# Patient Record
Sex: Male | Born: 1990 | Race: White | Hispanic: No | Marital: Single | Smoking: Current some day smoker
Health system: Southern US, Community
[De-identification: ages and names within clinical notes are randomized; demographics above are authoritative.]

## PROBLEM LIST (undated history)

## (undated) DIAGNOSIS — F32A Depression, unspecified: Secondary | ICD-10-CM

## (undated) DIAGNOSIS — F329 Major depressive disorder, single episode, unspecified: Secondary | ICD-10-CM

## (undated) DIAGNOSIS — B019 Varicella without complication: Secondary | ICD-10-CM

## (undated) HISTORY — DX: Depression, unspecified: F32.A

## (undated) HISTORY — PX: CLAVICLE SURGERY: SHX598

## (undated) HISTORY — DX: Major depressive disorder, single episode, unspecified: F32.9

## (undated) HISTORY — DX: Varicella without complication: B01.9

---
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
## 2012-09-07 ENCOUNTER — Ambulatory Visit: Payer: Self-pay | Admitting: Internal Medicine

## 2012-09-12 ENCOUNTER — Encounter: Payer: Self-pay | Admitting: Internal Medicine

## 2012-09-12 ENCOUNTER — Ambulatory Visit (INDEPENDENT_AMBULATORY_CARE_PROVIDER_SITE_OTHER): Payer: BC Managed Care – PPO | Admitting: Internal Medicine

## 2012-09-12 VITALS — BP 124/76 | HR 84 | Resp 16 | Ht 70.25 in | Wt 141.0 lb

## 2012-09-12 DIAGNOSIS — F988 Other specified behavioral and emotional disorders with onset usually occurring in childhood and adolescence: Secondary | ICD-10-CM

## 2012-09-12 DIAGNOSIS — R110 Nausea: Secondary | ICD-10-CM

## 2012-09-12 DIAGNOSIS — F411 Generalized anxiety disorder: Secondary | ICD-10-CM

## 2012-09-12 DIAGNOSIS — Z7189 Other specified counseling: Secondary | ICD-10-CM

## 2012-09-12 NOTE — Patient Instructions (Signed)
Injury prevention measures . Consider  Taking.  Med on weekend days . Something small for breakfast   And small snacks meals frequently . If losing weight or   persistent or progressive stomach sx then .  Contact us for follow up. Get appt for fasting labs  After you talk with your family.

## 2012-09-12 NOTE — Progress Notes (Signed)
Chief Complaint  Patient presents with  . Establish Care    HPI: Patient comes in as new patient visit . Previous care was  Elsewhere . Parent patients in our practice . Generally heathy but has add and anxiety.  Some gi sx    On add med  For  Years and now changing to other meds . Had hair pulling habit with other meds and recently changed to amphetamine ER 10 mg doing better after a few months  AmySstevenson.   About 2 x per year.      Anxiety : onFluoxetine for  For anxiety  Since  Middle school.  Helps focus in.   No se   Has had some recenet  Nausea feeling without vomiting  change in bowel habits blood in stool  Weight loss. No reg nsaid etoh .some caffeine.   PMH hx  LONG BOARDING INJURY  SURGERY Clavicle. Left still gets  Neuralgia sx at times  ROS: See pertinent positives and negatives per HPI. Attending [employer] interest full time Still active in longboarding ROS:  GEN/ HEENT: No fever, significant weight changes sweats headaches vision problems hearing changes, CV/ PULM; No chest pain shortness of breath cough, syncope,edema  change in exercise tolerance. GI /GU: No  change in bowel habits. No blood in the stool. No significant GU symptoms. SKIN/HEME: ,no acute skin rashes suspicious lesions or bleeding. No lymphadenopathy, nodules, masses.  NEURO/ PSYCH:  No neurologic signs such as weakness numbness. No depression  MM/ Allergy: No unusual infections.  Allergy .   REST of 12 system review negative except as per HPI   Past Medical History  Diagnosis Date  . Chicken pox   . Depression     Family History  Problem Relation Age of Onset  . Miscarriages / [place] Mother     3 miscarriages  . Arthritis Father   . Arthritis Maternal Grandmother   . Arthritis Maternal Grandfather   . Hearing loss Maternal Grandfather     History   Social History  . Marital Status: Single    Spouse Name: N/A    Number of Children: N/A  . Years of Education: N/A    Social History Main Topics  . Smoking status: Current Every Day Smoker  . Smokeless tobacco: Current User  . Alcohol Use: No  . Drug Use: None  . Sexually Active: None   Other Topics Concern  . None   Social History Narrative   HH  Of 3  Parents    Pets dog and cat.    Shifting  From tobacco  2 per day and now   Electronic  .    [address]    caffiene once a week.    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    Outpatient Encounter Prescriptions as of 09/12/2012  Medication Sig Dispense Refill  . amphetamine-dextroamphetamine (ADDERALL XR) 10 MG 24 hr capsule Take 10 mg by mouth every morning.      Marland Kitchen FLUoxetine (PROZAC) 20 MG tablet Take 20 mg by mouth daily.       No facility-administered encounter medications on file as of 09/12/2012.    EXAM:  BP 124/76  Pulse 84  Resp 16  Ht 5' 10.25" (1.784 m)  Wt 141 lb (63.957 kg)  BMI 20.1 kg/m2  SpO2 98%  Body mass index is 20.1 kg/(m^2). Physical Exam: Vital signs reviewed FAO:ZHYQ is a well-developed well-nourished  But  Slender alert  cooperative  White male  who appears  stated age in no acute distress.  HEENT: normocephalic  traumatic , green colored hair Eyes: PERRL EOM's full, conjunctiva clear, Nares: patent no deformity discharge or tenderness., Ears: no deformity EAC's clear TMs with normal landmarks. Pierced ears  Mouth: clear OP, no lesions, edema.  Moist mucous membranes. Dentition in adequate repair. NECK: supple without masses, thyromegaly or bruits. CHEST/PULM:  Clear to auscultation and percussion breath sounds equal no wheeze , rales or rhonchi. No chest wall deformities or tenderness. CV: PMI is nondisplaced, S1 S2 no gallops, murmurs, rubs. Peripheral pulses are full without delay.No JVD .  ABDOMEN: Bowel sounds normal nontender  No guard or rebound, no hepato splenomegal no CVA tenderness.   Extremtities:  No clubbing cyanosis or edema, no acute joint swelling or redness no focal atrophy scar  left upper clavicle area  NEURO:  Oriented x3, cranial nerves 3-12 appear to be intact, no obvious focal weakness,gait within normal limits no abnormal reflexes or asymmetrical SKIN: No acute rashes normal turgor, color, no bruising or petechiae. PSYCH: Oriented, good eye contact, no obvious depression anxiety, cognition and judgment appear normal. LN:  No cervical  adenopathy  ASSESSMENT AND PLAN:  Discussed the following assessment and plan:  Nausea alone - poss dietary and or med related  reviewed dietary   Counseling on health promotion and disease prevention  ADD (attention deficit disorder)  Anxiety state, unspecified Labs when convenient   Fu wellness visit in a year  cpx labs when convenient  -Patient advised to return or notify health care team  if symptoms worsen or persist or new concerns arise.  Disc complete tobacco cesssation Disc  hiv testing no exposure risk etc, declined    Patient Instructions  Injury prevention measures . Consider  Taking.  Med on weekend days . Something small for breakfast   And small snacks meals frequently . If losing weight or   persistent or progressive stomach sx then .  Contact us for follow up. Get appt for fasting labs  After you talk with your family.     Neta Mends. Cornelius Schuitema M.D.

## 2012-09-14 DIAGNOSIS — R110 Nausea: Secondary | ICD-10-CM | POA: Insufficient documentation

## 2012-10-10 ENCOUNTER — Encounter: Payer: Self-pay | Admitting: Internal Medicine

## 2012-10-19 ENCOUNTER — Other Ambulatory Visit (INDEPENDENT_AMBULATORY_CARE_PROVIDER_SITE_OTHER): Payer: BC Managed Care – PPO

## 2012-10-19 DIAGNOSIS — Z0000 Encounter for general adult medical examination without abnormal findings: Secondary | ICD-10-CM

## 2012-10-19 LAB — CBC WITH DIFFERENTIAL/PLATELET
Basophils Absolute: 0 10*3/uL (ref 0.0–0.1)
Basophils Relative: 0.5 % (ref 0.0–3.0)
Eosinophils Absolute: 0.1 10*3/uL (ref 0.0–0.7)
Eosinophils Relative: 1.4 % (ref 0.0–5.0)
HCT: 48.8 % (ref 39.0–52.0)
Hemoglobin: 16.6 g/dL (ref 13.0–17.0)
Lymphocytes Relative: 35 % (ref 12.0–46.0)
Lymphs Abs: 2.3 10*3/uL (ref 0.7–4.0)
MCHC: 34 g/dL (ref 30.0–36.0)
MCV: 89.2 fl (ref 78.0–100.0)
Monocytes Absolute: 0.7 10*3/uL (ref 0.1–1.0)
Monocytes Relative: 10.6 % (ref 3.0–12.0)
Neutro Abs: 3.4 10*3/uL (ref 1.4–7.7)
Neutrophils Relative %: 52.5 % (ref 43.0–77.0)
Platelets: 221 10*3/uL (ref 150.0–400.0)
RBC: 5.48 Mil/uL (ref 4.22–5.81)
RDW: 12.7 % (ref 11.5–14.6)
WBC: 6.5 10*3/uL (ref 4.5–10.5)

## 2012-10-19 LAB — HEPATIC FUNCTION PANEL
ALT: 21 U/L (ref 0–53)
AST: 18 U/L (ref 0–37)
Albumin: 4.8 g/dL (ref 3.5–5.2)
Alkaline Phosphatase: 83 U/L (ref 39–117)
Bilirubin, Direct: 0.1 mg/dL (ref 0.0–0.3)
Total Bilirubin: 1 mg/dL (ref 0.3–1.2)
Total Protein: 7.8 g/dL (ref 6.0–8.3)

## 2012-10-19 LAB — LIPID PANEL
Cholesterol: 134 mg/dL (ref 0–200)
HDL: 32.4 mg/dL — ABNORMAL LOW (ref >39.00)
LDL Cholesterol: 87 mg/dL (ref 0–99)
Total CHOL/HDL Ratio: 4
Triglycerides: 75 mg/dL (ref 0.0–149.0)
VLDL: 15 mg/dL (ref 0.0–40.0)

## 2012-10-19 LAB — BASIC METABOLIC PANEL
BUN: 19 mg/dL (ref 6–23)
CO2: 28 mEq/L (ref 19–32)
Calcium: 9.4 mg/dL (ref 8.4–10.5)
Chloride: 102 mEq/L (ref 96–112)
Creatinine, Ser: 1 mg/dL (ref 0.4–1.5)
GFR: 104.66 mL/min (ref >60.00)
Glucose, Bld: 86 mg/dL (ref 70–99)
Potassium: 3.9 mEq/L (ref 3.5–5.1)
Sodium: 138 mEq/L (ref 135–145)

## 2012-10-19 LAB — TSH: TSH: 1.35 u[IU]/mL (ref 0.35–5.50)

## 2013-05-30 ENCOUNTER — Other Ambulatory Visit: Payer: Self-pay

## 2014-05-09 ENCOUNTER — Other Ambulatory Visit: Payer: Self-pay

## 2014-07-24 ENCOUNTER — Emergency Department (HOSPITAL_BASED_OUTPATIENT_CLINIC_OR_DEPARTMENT_OTHER)
Admission: EM | Admit: 2014-07-24 | Discharge: 2014-07-25 | Disposition: A | Discharge status: Home / Self Care | Payer: BC Managed Care – PPO | Attending: Emergency Medicine | Admitting: Emergency Medicine

## 2014-07-24 ENCOUNTER — Encounter (HOSPITAL_BASED_OUTPATIENT_CLINIC_OR_DEPARTMENT_OTHER): Payer: Self-pay | Admitting: *Deleted

## 2014-07-24 ENCOUNTER — Emergency Department (HOSPITAL_BASED_OUTPATIENT_CLINIC_OR_DEPARTMENT_OTHER): Payer: BC Managed Care – PPO

## 2014-07-24 DIAGNOSIS — R079 Chest pain, unspecified: Secondary | ICD-10-CM | POA: Diagnosis present

## 2014-07-24 DIAGNOSIS — Z79899 Other long term (current) drug therapy: Secondary | ICD-10-CM | POA: Diagnosis not present

## 2014-07-24 DIAGNOSIS — R202 Paresthesia of skin: Secondary | ICD-10-CM | POA: Diagnosis not present

## 2014-07-24 DIAGNOSIS — Z720 Tobacco use: Secondary | ICD-10-CM | POA: Diagnosis not present

## 2014-07-24 DIAGNOSIS — R0789 Other chest pain: Secondary | ICD-10-CM | POA: Diagnosis not present

## 2014-07-24 DIAGNOSIS — F329 Major depressive disorder, single episode, unspecified: Secondary | ICD-10-CM | POA: Diagnosis not present

## 2014-07-24 DIAGNOSIS — Z8619 Personal history of other infectious and parasitic diseases: Secondary | ICD-10-CM | POA: Insufficient documentation

## 2014-07-24 DIAGNOSIS — R52 Pain, unspecified: Secondary | ICD-10-CM

## 2014-07-24 MED ORDER — PANTOPRAZOLE SODIUM 40 MG PO TBEC
40.0000 mg | DELAYED_RELEASE_TABLET | Freq: Once | ORAL | Status: AC
  Administered 2014-07-25: 40 mg via ORAL
  Filled 2014-07-24: qty 1

## 2014-07-24 MED ORDER — OMEPRAZOLE 20 MG PO CPDR: 20.0000 mg | DELAYED_RELEASE_CAPSULE | Freq: Every day | ORAL | Status: DC

## 2014-07-24 NOTE — ED Notes (Signed)
Chest pain for a couple of days. Radiation into both arms.

## 2014-07-24 NOTE — ED Notes (Signed)
C/o cp x 2-3 days  Denies inj  Pain radiates down left arm at times and rt arm at times  No change w movement

## 2014-07-24 NOTE — ED Provider Notes (Addendum)
CSN: [id number]     Arrival date & time 07/24/14  2153 History   None    This chart was scribed for James Blackwell Karie Skowron, MD by Arlan OrganAshley Leger, ED Scribe. This patient was seen in room MH02/MH02 and the patient's care was started 10:43 PM.   Chief Complaint  Patient presents with  . Chest Pain   HPI  HPI Comments: Dyane Dustmanatrick R Bailon is a 23 y.o. male with a PMHx of depression who presents to the Emergency Department complaining of constant, non-radiating chest pain sometimes with associated paresthesias of the R hand x 2 days. Pain described as "burning" and rated 5/10 at highest intensity. No exacerbation with exertion, movement, palpation or deep breathing. However, symptoms are worse when he is in bed. No recent injury or trauma. No fever, chills, vomiting, SOB, or diaphoresis. He denies any recent long distance travels.   Past Medical History  Diagnosis Date  . Chicken pox   . Depression    Past Surgical History  Procedure Laterality Date  . Clavicle surgery Left     3-4 years ago   Family History  Problem Relation Age of Onset  . Miscarriages / [place] Mother     3 miscarriages  . Arthritis Father   . Arthritis Maternal Grandmother   . Arthritis Maternal Grandfather   . Hearing loss Maternal Grandfather    History  Substance Use Topics  . Smoking status: Current Some Day Smoker  . Smokeless tobacco: Current User  . Alcohol Use: Yes    Review of Systems  All other systems reviewed and are negative.  Allergies  Review of patient's allergies indicates no known allergies.  Home Medications   Prior to Admission medications   Medication Sig Start Date End Date Taking? Authorizing Provider  amphetamine-dextroamphetamine (ADDERALL XR) 10 MG 24 hr capsule Take 10 mg by mouth every morning.    Historical Provider, MD  FLUoxetine (PROZAC) 20 MG tablet Take 20 mg by mouth daily.    Historical Provider, MD   Triage Vitals: BP 131/82 mmHg  Pulse 85  Temp(Src) 98.9 F (37.2 C)  (Oral)  Resp 20  Ht 5\' 10"  (1.778 m)  Wt 150 lb (68.04 kg)  BMI 21.52 kg/m2  SpO2 100%   Physical Exam General: Well-developed, well-nourished male in no acute distress; appearance consistent with age of record HENT: normocephalic; atraumatic Eyes: pupils equal, round and reactive to light; extraocular muscles intact Neck: supple Heart: regular rate and rhythm; no murmurs, rubs or gallops Lungs: clear to auscultation bilaterally Chest: Non-tender Abdomen: soft; nondistended; nontender; no masses or hepatosplenomegaly; bowel sounds present Extremities: No deformity; full range of motion; pulses normal Neurologic: Awake, alert and oriented; motor function intact in all extremities and symmetric; no facial droop Skin: Warm and dry Psychiatric: Normal mood and affect   ED Course  Procedures (including critical care time)    EKG Interpretation   Date/Time:  Thursday July 24 2014 22:08:34 EST Ventricular Rate:  83 PR Interval:  150 QRS Duration: 92 QT Interval:  358 QTC Calculation: 420 R Axis:   86 Text Interpretation:  Normal sinus rhythm with sinus arrhythmia Normal ECG  No previous ECGs available Confirmed by Clemon Devaul  MD, Jonny RuizJOHN ([id number]) on  07/24/2014 10:32:44 PM      MDM  Nursing notes and vitals signs, including pulse oximetry, reviewed.  Summary of this visit's results, reviewed by myself:  Imaging Studies: Dg Chest 2 View  07/24/2014   CLINICAL DATA:  Chest pain for 3  days. Chest pain radiating down both arms. History of a clavicle fracture.  EXAM: CHEST  2 VIEW  COMPARISON:  None.  FINDINGS: Cardiopericardial silhouette within normal limits. Mediastinal contours normal. Trachea midline. No airspace disease or effusion.  IMPRESSION: No active cardiopulmonary disease.   Electronically Signed   By: Andreas NewportGeoffrey  Lamke M.D.   On: 07/24/2014 23:42   11:53 PM Patient symptoms may be related to GERD. We'll start him on a PPI. Also be having an adverse reaction to  Adderall. He was advised to follow-up with his primary care physician.  I personally performed the services described in this documentation, which was scribed in my presence. The recorded information has been reviewed and is accurate.    James Blackwell Gilma Bessette, MD 07/24/14 2354  James Blackwell Antawn Sison, MD 07/24/14 [phone]

## 2014-07-25 DIAGNOSIS — R202 Paresthesia of skin: Secondary | ICD-10-CM | POA: Diagnosis not present

## 2014-07-25 DIAGNOSIS — Z8619 Personal history of other infectious and parasitic diseases: Secondary | ICD-10-CM | POA: Diagnosis not present

## 2014-07-25 DIAGNOSIS — Z720 Tobacco use: Secondary | ICD-10-CM | POA: Diagnosis not present

## 2014-07-25 DIAGNOSIS — R079 Chest pain, unspecified: Secondary | ICD-10-CM | POA: Diagnosis present

## 2014-07-25 DIAGNOSIS — R0789 Other chest pain: Secondary | ICD-10-CM | POA: Diagnosis not present

## 2014-07-25 DIAGNOSIS — F329 Major depressive disorder, single episode, unspecified: Secondary | ICD-10-CM | POA: Diagnosis not present

## 2014-07-25 DIAGNOSIS — Z79899 Other long term (current) drug therapy: Secondary | ICD-10-CM | POA: Diagnosis not present

## 2014-11-12 ENCOUNTER — Encounter: Payer: Self-pay | Admitting: Internal Medicine

## 2014-11-12 ENCOUNTER — Ambulatory Visit (INDEPENDENT_AMBULATORY_CARE_PROVIDER_SITE_OTHER): Payer: 59 | Admitting: Internal Medicine

## 2014-11-12 VITALS — BP 134/72 | Temp 98.3°F | Ht 69.0 in | Wt 147.2 lb

## 2014-11-12 DIAGNOSIS — R079 Chest pain, unspecified: Secondary | ICD-10-CM | POA: Diagnosis not present

## 2014-11-12 DIAGNOSIS — K219 Gastro-esophageal reflux disease without esophagitis: Secondary | ICD-10-CM | POA: Diagnosis not present

## 2014-11-12 DIAGNOSIS — F418 Other specified anxiety disorders: Secondary | ICD-10-CM

## 2014-11-12 MED ORDER — RANITIDINE HCL 150 MG PO TABS: 150.0000 mg | ORAL_TABLET | Freq: Two times a day (BID) | ORAL | Status: DC

## 2014-11-12 NOTE — Progress Notes (Signed)
Pre visit review using our clinic review tool, if applicable. No additional management support is needed unless otherwise documented below in the visit note.  Chief Complaint  Patient presents with  . Heartburn    HPI: Patient James Blackwell  comes in today for  for  new problem evaluation. Seen once in 2014   For nausea  And now here for  Above  concern abou cp reflux and anxiety  Had ed visit December nl ekg felt to be gerd sx  Since jan taking omeprezole and changed  diet  Doing much better  and went off  meds for few weeks  [place]. No tobacco   No caffiene tea not often.  Some sx came back  Not daily Tends to get   Anxious about this  recently  Clearence Cheek /coworker said "if smell burning toast  Having  Heart attack"  Though he did   Then had panic attack   Mid chest Burning  During day if takes med better   Nota bad.  No dysphagia  Not at night . waterbrash if has pizza.  Works Psychologist, prison and probation services.   Hours per week.  Day  6-2: 30  18 months  high standard  Stress but likes his job otherwise . Sleep about 8 hours Plus.  ROS: See pertinent positives and negatives per HPI. No cough syncope fever vomiting weigh tloss Games a lot  Says off adderall and prilosec for a while was nor depression and not really anxiety   Past Medical History  Diagnosis Date  . Chicken pox   . Depression     Family History  Problem Relation Age of Onset  . Miscarriages / [place] Mother     3 miscarriages  . Arthritis Father   . Arthritis Maternal Grandmother   . Arthritis Maternal Grandfather   . Hearing loss Maternal Grandfather     History   Social History  . Marital Status: Single    Spouse Name: N/A  . Number of Children: N/A  . Years of Education: N/A   Social History Main Topics  . Smoking status: Current Some Day Smoker  . Smokeless tobacco: Current User  . Alcohol Use: Yes  . Drug Use: No  . Sexual Activity: Not on file   Other Topics Concern  . None   Social  History Narrative   HH  Of 3  Parents    Pets dog and cat.    Shifting  From tobacco  2 per day and now   Electronic  .    [address]    caffiene once a week.    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 omeprazole (PRILOSEC) 20 MG capsule Take 1 capsule (20 mg total) by mouth daily.  . ranitidine (ZANTAC) 150 MG tablet Take 1 tablet (150 mg total) by mouth 2 (two) times daily.  . [DISCONTINUED] amphetamine-dextroamphetamine (ADDERALL XR) 10 MG 24 hr capsule Take 10 mg by mouth every morning.  . [DISCONTINUED] FLUoxetine (PROZAC) 20 MG tablet Take 20 mg by mouth daily.    EXAM:  BP 134/72 mmHg  Temp(Src) 98.3 F (36.8 C) (Oral)  Ht  (1.753 m)  Wt 147 lb 3.2 oz (66.769 kg)  BMI 21.73 kg/m2  Body mass index is 21.73 kg/(m^2).  GENERAL: vitals reviewed and listed  above, alert, oriented, appears well hydrated and in no acute distress HEENT: atraumatic, conjunctiva  clear, no obvious abnormalities on inspection of external nose and ears NECK: no obvious masses on inspection palpation  LUNGS: clear to auscultation bilaterally, no wheezes, rales or rhonchi, good air movement CV: HRRR, no clubbing cyanosis or  peripheral edema nl cap refill  Abdomen:  Sof,t normal bowel sounds without hepatosplenomegaly, no guarding rebound or masses no CVA tenderness MS: moves all extremities without noticeable focal  abnormality PSYCH: pleasant and cooperative, no obvious depression   Slight anxiety nl speech though process   Neuro non focal   ASSESSMENT AND PLAN:  Discussed the following assessment and plan:  Chest pain, unspecified chest pain type  Gastroesophageal reflux disease, esophagitis presence not specified  Situational anxiety - with panic from physical sx  disc options has good support  add cousnleing if not relieving   Disc interventinos about anxiety and stress    Counseling   Daily meds such as ssri but her feels situational and willl rx the hb sx  And fu.  [place]  Prefer to see if can contorle with ls and  Also H2 blocker instead  To avoid poss long  Term neagtives   consdieration of chagne to protonix   ba swallow etc.  But not now indicated .  -Patient advised to return or notify health care team  if symptoms worsen ,persist or new concerns arise.  Patient Instructions  Begin ranitidine 150 mg 1 po twice a day.     May not be as complete but lets see if can   Exercise  And healthy sleep is healthy.  For  anxiety .   Consideration  of  Anxiety counseling.   Techniques if needed.   Plan ROV in 1 month   And decide if other evaluation.  Healthy lifestyle includes : At least 150 minutes of exercise weeks  , weight at healthy levels, which is usually   BMI 19-25. Avoid trans fats and processed foods;  Increase fresh fruits and veges to 5 servings per day. And avoid sweet beverages including tea and juice. Mediterranean diet with olive oil and nuts have been noted to be heart and brain healthy . Avoid tobacco products . Limit  alcohol to  7 per week for women and 14 servings for men.  Get adequate sleep . Wear seat belts . Don't text and drive .     Food Choices for Gastroesophageal Reflux Disease When you have gastroesophageal reflux disease (GERD), the foods you eat and your eating habits are very important. Choosing the right foods can help ease the discomfort of GERD. WHAT GENERAL GUIDELINES DO I NEED TO FOLLOW?  Choose fruits, vegetables, whole grains, low-fat dairy products, and low-fat meat, fish, and poultry.  Limit fats such as oils, salad dressings, butter, nuts, and avocado.  Keep a food diary to identify foods that cause symptoms.  Avoid foods that cause reflux. These may be different for different people.  Eat frequent small meals instead of three large meals each day.  Eat your meals slowly, in a relaxed setting.  Limit  fried foods.  [place] foods using methods other than frying.  Avoid drinking alcohol.  Avoid drinking large amounts of liquids with your meals.  Avoid bending over or lying down until 2-3 hours after eating. WHAT FOODS ARE NOT RECOMMENDED? The following are some foods and drinks that may worsen your symptoms: Vegetables Tomatoes. Tomato juice. Tomato and spaghetti sauce. Chili peppers.  Onion and garlic. Horseradish. Fruits Oranges, grapefruit, and lemon (fruit and juice). Meats High-fat meats, fish, and poultry. This includes hot dogs, ribs, ham, sausage, salami, and bacon. Dairy Whole milk and chocolate milk. Sour cream. Cream. Butter. Ice cream. Cream cheese.  Beverages Coffee and tea, with or without caffeine. Carbonated beverages or energy drinks. Condiments Hot sauce. Barbecue sauce.  Sweets/Desserts Chocolate and cocoa. Donuts. Peppermint and spearmint. Fats and Oils High-fat foods, including [place] fries and potato chips. Other Vinegar. Strong spices, such as black pepper, white pepper, red pepper, cayenne, curry powder, cloves, ginger, and chili powder. The items listed above may not be a complete list of foods and beverages to avoid. Contact your dietitian for more information. Document Released: 07/11/2005 Document Revised: 07/16/2013 Document Reviewed: 05/15/2013 [facility] Patient Information 2015 [place], [place]. This information is not intended to replace advice given to you by your health care provider. Make sure you discuss any questions you have with your health care provider.   Panic Attacks Panic attacks are sudden, short-livedsurges of severe anxiety, fear, or discomfort. They may occur for no reason when you are relaxed, when you are anxious, or when you are sleeping. Panic attacks may occur for a number of reasons:   Healthy people occasionally have panic attacks in extreme, life-threatening situations, such as war or natural disasters. Normal anxiety is a  protective mechanism of the body that helps [place] react to danger (fight or flight response).  Panic attacks are often seen with anxiety disorders, such as panic disorder, social anxiety disorder, generalized anxiety disorder, and phobias. Anxiety disorders cause excessive or uncontrollable anxiety. They may interfere with your relationships or other life activities.  Panic attacks are sometimes seen with other mental illnesses, such as depression and posttraumatic stress disorder.  Certain medical conditions, prescription medicines, and drugs of abuse can cause panic attacks. SYMPTOMS  Panic attacks start suddenly, peak within 20 minutes, and are accompanied by four or more of the following symptoms:  Pounding heart or fast heart rate (palpitations).  Sweating.  Trembling or shaking.  Shortness of breath or feeling smothered.  Feeling choked.  Chest pain or discomfort.  Nausea or strange feeling in your stomach.  Dizziness, light-headedness, or feeling like you will faint.  Chills or hot flushes.  Numbness or tingling in your lips or hands and feet.  Feeling that things are not real or feeling that you are not yourself.  Fear of losing control or going crazy.  Fear of dying. Some of these symptoms can mimic serious medical conditions. For example, you may think you are having a heart attack. Although panic attacks can be very scary, they are not life threatening. DIAGNOSIS  Panic attacks are diagnosed through an assessment by your health care provider. Your health care provider will ask questions about your symptoms, such as where and when they occurred. Your health care provider will also ask about your medical history and use of alcohol and drugs, including prescription medicines. Your health care provider may order blood tests or other studies to rule out a serious medical condition. Your health care provider may refer you to a mental health professional for further  evaluation. TREATMENT   Most healthy people who have one or two panic attacks in an extreme, life-threatening situation will not require treatment.  The treatment for panic attacks associated with anxiety disorders or other mental illness typically involves counseling with a mental health professional, medicine, or a combination of both. Your health care provider will help determine  what treatment is best for you.  Panic attacks due to physical illness usually go away with treatment of the illness. If prescription medicine is causing panic attacks, talk with your health care provider about stopping the medicine, decreasing the dose, or substituting another medicine.  Panic attacks due to alcohol or drug abuse go away with abstinence. Some adults need professional help in order to stop drinking or using drugs. HOME CARE INSTRUCTIONS   Take all medicines as directed by your health care provider.   Schedule and attend follow-up visits as directed by your health care provider. It is important to keep all your appointments. SEEK MEDICAL CARE IF:  You are not able to take your medicines as prescribed.  Your symptoms do not improve or get worse. SEEK IMMEDIATE MEDICAL CARE IF:   You experience panic attack symptoms that are different than your usual symptoms.  You have serious thoughts about hurting yourself or others.  You are taking medicine for panic attacks and have a serious side effect. MAKE SURE YOU:  Understand these instructions.  Will watch your condition.  Will get help right away if you are not doing well or get worse. Document Released: 07/11/2005 Document Revised: 07/16/2013 Document Reviewed: 02/22/2013 [facility] Patient Information 2015 [place], [place]. This information is not intended to replace advice given to you by your health care provider. Make sure you discuss any questions you have with your health care provider.        Neta MendsWanda K. Harce Volden M.D.

## 2014-11-12 NOTE — Patient Instructions (Signed)
Begin ranitidine 150 mg 1 po twice a day.     May not be as complete but lets see if can   Exercise  And healthy sleep is healthy.  For  anxiety .   Consideration  of  Anxiety counseling.   Techniques if needed.   Plan ROV in 1 month   And decide if other evaluation.  Healthy lifestyle includes : At least 150 minutes of exercise weeks  , weight at healthy levels, which is usually   BMI 19-25. Avoid trans fats and processed foods;  Increase fresh fruits and veges to 5 servings per day. And avoid sweet beverages including tea and juice. Mediterranean diet with olive oil and nuts have been noted to be heart and brain healthy . Avoid tobacco products . Limit  alcohol to  7 per week for women and 14 servings for men.  Get adequate sleep . Wear seat belts . Don't text and drive .     Food Choices for Gastroesophageal Reflux Disease When you have gastroesophageal reflux disease (GERD), the foods you eat and your eating habits are very important. Choosing the right foods can help ease the discomfort of GERD. WHAT GENERAL GUIDELINES DO I NEED TO FOLLOW?  Choose fruits, vegetables, whole grains, low-fat dairy products, and low-fat meat, fish, and poultry.  Limit fats such as oils, salad dressings, butter, nuts, and avocado.  Keep a food diary to identify foods that cause symptoms.  Avoid foods that cause reflux. These may be different for different people.  Eat frequent small meals instead of three large meals each day.  Eat your meals slowly, in a relaxed setting.  Limit fried foods.  Klemp foods using methods other than frying.  Avoid drinking alcohol.  Avoid drinking large amounts of liquids with your meals.  Avoid bending over or lying down until 2-3 hours after eating. WHAT FOODS ARE NOT RECOMMENDED? The following are some foods and drinks that may worsen your symptoms: Vegetables Tomatoes. Tomato juice. Tomato and spaghetti sauce. Chili peppers. Onion and garlic.  Horseradish. Fruits Oranges, grapefruit, and lemon (fruit and juice). Meats High-fat meats, fish, and poultry. This includes hot dogs, ribs, ham, sausage, salami, and bacon. Dairy Whole milk and chocolate milk. Sour cream. Cream. Butter. Ice cream. Cream cheese.  Beverages Coffee and tea, with or without caffeine. Carbonated beverages or energy drinks. Condiments Hot sauce. Barbecue sauce.  Sweets/Desserts Chocolate and cocoa. Donuts. Peppermint and spearmint. Fats and Oils High-fat foods, including [place] fries and potato chips. Other Vinegar. Strong spices, such as black pepper, white pepper, red pepper, cayenne, curry powder, cloves, ginger, and chili powder. The items listed above may not be a complete list of foods and beverages to avoid. Contact your dietitian for more information. Document Released: 07/11/2005 Document Revised: 07/16/2013 Document Reviewed: 05/15/2013 [facility] Patient Information 2015 [place], [place]. This information is not intended to replace advice given to you by your health care provider. Make sure you discuss any questions you have with your health care provider.   Panic Attacks Panic attacks are sudden, short-livedsurges of severe anxiety, fear, or discomfort. They may occur for no reason when you are relaxed, when you are anxious, or when you are sleeping. Panic attacks may occur for a number of reasons:   Healthy people occasionally have panic attacks in extreme, life-threatening situations, such as war or natural disasters. Normal anxiety is a protective mechanism of the body that helps [place] react to danger (fight or flight response).  Panic attacks are  often seen with anxiety disorders, such as panic disorder, social anxiety disorder, generalized anxiety disorder, and phobias. Anxiety disorders cause excessive or uncontrollable anxiety. They may interfere with your relationships or other life activities.  Panic attacks are sometimes seen with other  mental illnesses, such as depression and posttraumatic stress disorder.  Certain medical conditions, prescription medicines, and drugs of abuse can cause panic attacks. SYMPTOMS  Panic attacks start suddenly, peak within 20 minutes, and are accompanied by four or more of the following symptoms:  Pounding heart or fast heart rate (palpitations).  Sweating.  Trembling or shaking.  Shortness of breath or feeling smothered.  Feeling choked.  Chest pain or discomfort.  Nausea or strange feeling in your stomach.  Dizziness, light-headedness, or feeling like you will faint.  Chills or hot flushes.  Numbness or tingling in your lips or hands and feet.  Feeling that things are not real or feeling that you are not yourself.  Fear of losing control or going crazy.  Fear of dying. Some of these symptoms can mimic serious medical conditions. For example, you may think you are having a heart attack. Although panic attacks can be very scary, they are not life threatening. DIAGNOSIS  Panic attacks are diagnosed through an assessment by your health care provider. Your health care provider will ask questions about your symptoms, such as where and when they occurred. Your health care provider will also ask about your medical history and use of alcohol and drugs, including prescription medicines. Your health care provider may order blood tests or other studies to rule out a serious medical condition. Your health care provider may refer you to a mental health professional for further evaluation. TREATMENT   Most healthy people who have one or two panic attacks in an extreme, life-threatening situation will not require treatment.  The treatment for panic attacks associated with anxiety disorders or other mental illness typically involves counseling with a mental health professional, medicine, or a combination of both. Your health care provider will help determine what treatment is best for  you.  Panic attacks due to physical illness usually go away with treatment of the illness. If prescription medicine is causing panic attacks, talk with your health care provider about stopping the medicine, decreasing the dose, or substituting another medicine.  Panic attacks due to alcohol or drug abuse go away with abstinence. Some adults need professional help in order to stop drinking or using drugs. HOME CARE INSTRUCTIONS   Take all medicines as directed by your health care provider.   Schedule and attend follow-up visits as directed by your health care provider. It is important to keep all your appointments. SEEK MEDICAL CARE IF:  You are not able to take your medicines as prescribed.  Your symptoms do not improve or get worse. SEEK IMMEDIATE MEDICAL CARE IF:   You experience panic attack symptoms that are different than your usual symptoms.  You have serious thoughts about hurting yourself or others.  You are taking medicine for panic attacks and have a serious side effect. MAKE SURE YOU:  Understand these instructions.  Will watch your condition.  Will get help right away if you are not doing well or get worse. Document Released: 07/11/2005 Document Revised: 07/16/2013 Document Reviewed: 02/22/2013 [facility] Patient Information 2015 [place]. This information is not intended to replace advice given to you by your health care provider. Make sure you discuss any questions you have with your health care provider.

## 2014-12-23 ENCOUNTER — Encounter: Payer: Self-pay | Admitting: Internal Medicine

## 2014-12-23 ENCOUNTER — Ambulatory Visit (INDEPENDENT_AMBULATORY_CARE_PROVIDER_SITE_OTHER): Payer: 59 | Admitting: Internal Medicine

## 2014-12-23 VITALS — BP 118/70 | Temp 98.8°F | Ht 69.0 in | Wt 146.2 lb

## 2014-12-23 DIAGNOSIS — R079 Chest pain, unspecified: Secondary | ICD-10-CM

## 2014-12-23 DIAGNOSIS — F418 Other specified anxiety disorders: Secondary | ICD-10-CM

## 2014-12-23 DIAGNOSIS — K219 Gastro-esophageal reflux disease without esophagitis: Secondary | ICD-10-CM | POA: Diagnosis not present

## 2014-12-23 MED ORDER — RANITIDINE HCL 150 MG PO TABS: 150.0000 mg | ORAL_TABLET | Freq: Two times a day (BID) | ORAL | Status: DC

## 2014-12-23 NOTE — Patient Instructions (Signed)
Continue  Healthy diet .  Anti reflux diet .  And  Can try weaning . No further work up at this time.   Can try once a day zantac.   If controls the problem .Marland Kitchen.  If  Not doing well then recheck as needed or for yearly wellness visits when due

## 2014-12-23 NOTE — Progress Notes (Signed)
Pre visit review using our clinic review tool, if applicable. No additional management support is needed unless otherwise documented below in the visit note.  Chief Complaint  Patient presents with  . Follow-up    HPI: James Blackwell 24 y.o. comesin for fu of atypical cp poss gi related and anxiety   forgetws ocass  . Says much better .  After about a day  With med . Anxiety is also gone since he has been better feels he is fine. Has tried taking one a day or skip dose and has had no rebound he has changed his diet to an antireflux diet and feels much better. He gets some breakthrough if he eats incorrectly but this doesn't bother him that much. No dysphasia side effects.  ROS: See pertinent positives and negatives per HPI. No current chest pain shortness of breath. Not taking Adderall for at least the last 6-9 months  Past Medical History  Diagnosis Date  . Chicken pox   . Depression     Family History  Problem Relation Age of Onset  . Miscarriages / [place] Mother     3 miscarriages  . Arthritis Father   . Arthritis Maternal Grandmother   . Arthritis Maternal Grandfather   . Hearing loss Maternal Grandfather     History   Social History  . Marital Status: Single    Spouse Name: N/A  . Number of Children: N/A  . Years of Education: N/A   Social History Main Topics  . Smoking status: Current Some Day Smoker    Types: Cigars  . Smokeless tobacco: Current User  . Alcohol Use: 0.0 oz/week    0 Standard drinks or equivalent per week  . Drug Use: No  . Sexual Activity: Not on file   Other Topics Concern  . None   Social History Narrative   HH  Of 3  Parents    Pets dog and cat.    Shifting  From tobacco  2 per day and now   Electronic  .    [address]    caffiene once a week.    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 ranitidine (ZANTAC)  150 MG tablet Take 1 tablet (150 mg total) by mouth 2 (two) times daily. 60 tablet 2   No facility-administered medications prior to visit.     EXAM:  BP 118/70 mmHg  Temp(Src) 98.8 F (37.1 C) (Oral)  Ht  (1.753 m)  Wt 146 lb 3.2 oz (66.316 kg)  BMI 21.58 kg/m2  Body mass index is 21.58 kg/(m^2).  GENERAL: vitals reviewed and listed above, alert, oriented, appears well hydrated and in no acute distress much more relaxed not anxious today. HEENT: atraumatic, conjunctiva  clear, no obvious abnormalities on inspection of external nose and ears NECK: no obvious masses on inspection palpation  LUNGS: clear to auscultation bilaterally, no wheezes, rales or rhonchi,  CV: HRRR, no clubbing cyanosis or  peripheral edema nl cap refill  Abdomen:  Sof,t normal bowel sounds without hepatosplenomegaly, no guarding rebound or masses no CVA tenderness MS: moves all extremities without noticeable focal  abnormality PSYCH: pleasant and cooperative, no obvious depression or anxiety Wt Readings from Last 3 Encounters:  12/23/14 146 lb 3.2 oz (66.316 kg)  11/12/14 147 lb 3.2 oz (66.769 kg)  07/24/14  150 lb (68.04 kg)    ASSESSMENT AND PLAN:  Discussed the following assessment and plan:  Gastroesophageal reflux disease, esophagitis presence not specified - better on h2 blocker   weana s tolerated with ls diet control  Situational anxiety - resolved  Chest pain, unspecified chest pain type - resolved with gi rx  Pt feels doing well and will call to come back if needed for this  -Patient advised to return or notify health care team  if symptoms worsen ,persist or new concerns arise.  Patient Instructions  Continue  Healthy diet .  Anti reflux diet .  And  Can try weaning . No further work up at this time.   Can try once a day zantac.   If controls the problem .Marland Kitchen.  If  Not doing well then recheck as needed or for yearly wellness visits when due     Forest ParkWanda K. Minie Roadcap M.D.

## 2016-02-18 DIAGNOSIS — H5213 Myopia, bilateral: Secondary | ICD-10-CM | POA: Diagnosis not present

## 2016-06-21 IMAGING — CR DG CHEST 2V
2 series · 2 of 2 positions shown · non-contrast
Comparison: None.

CLINICAL DATA: Chest pain for 3 days. Chest pain radiating down
both arms. History of a clavicle fracture.

EXAM:
CHEST  2 VIEW

[w chest pa]
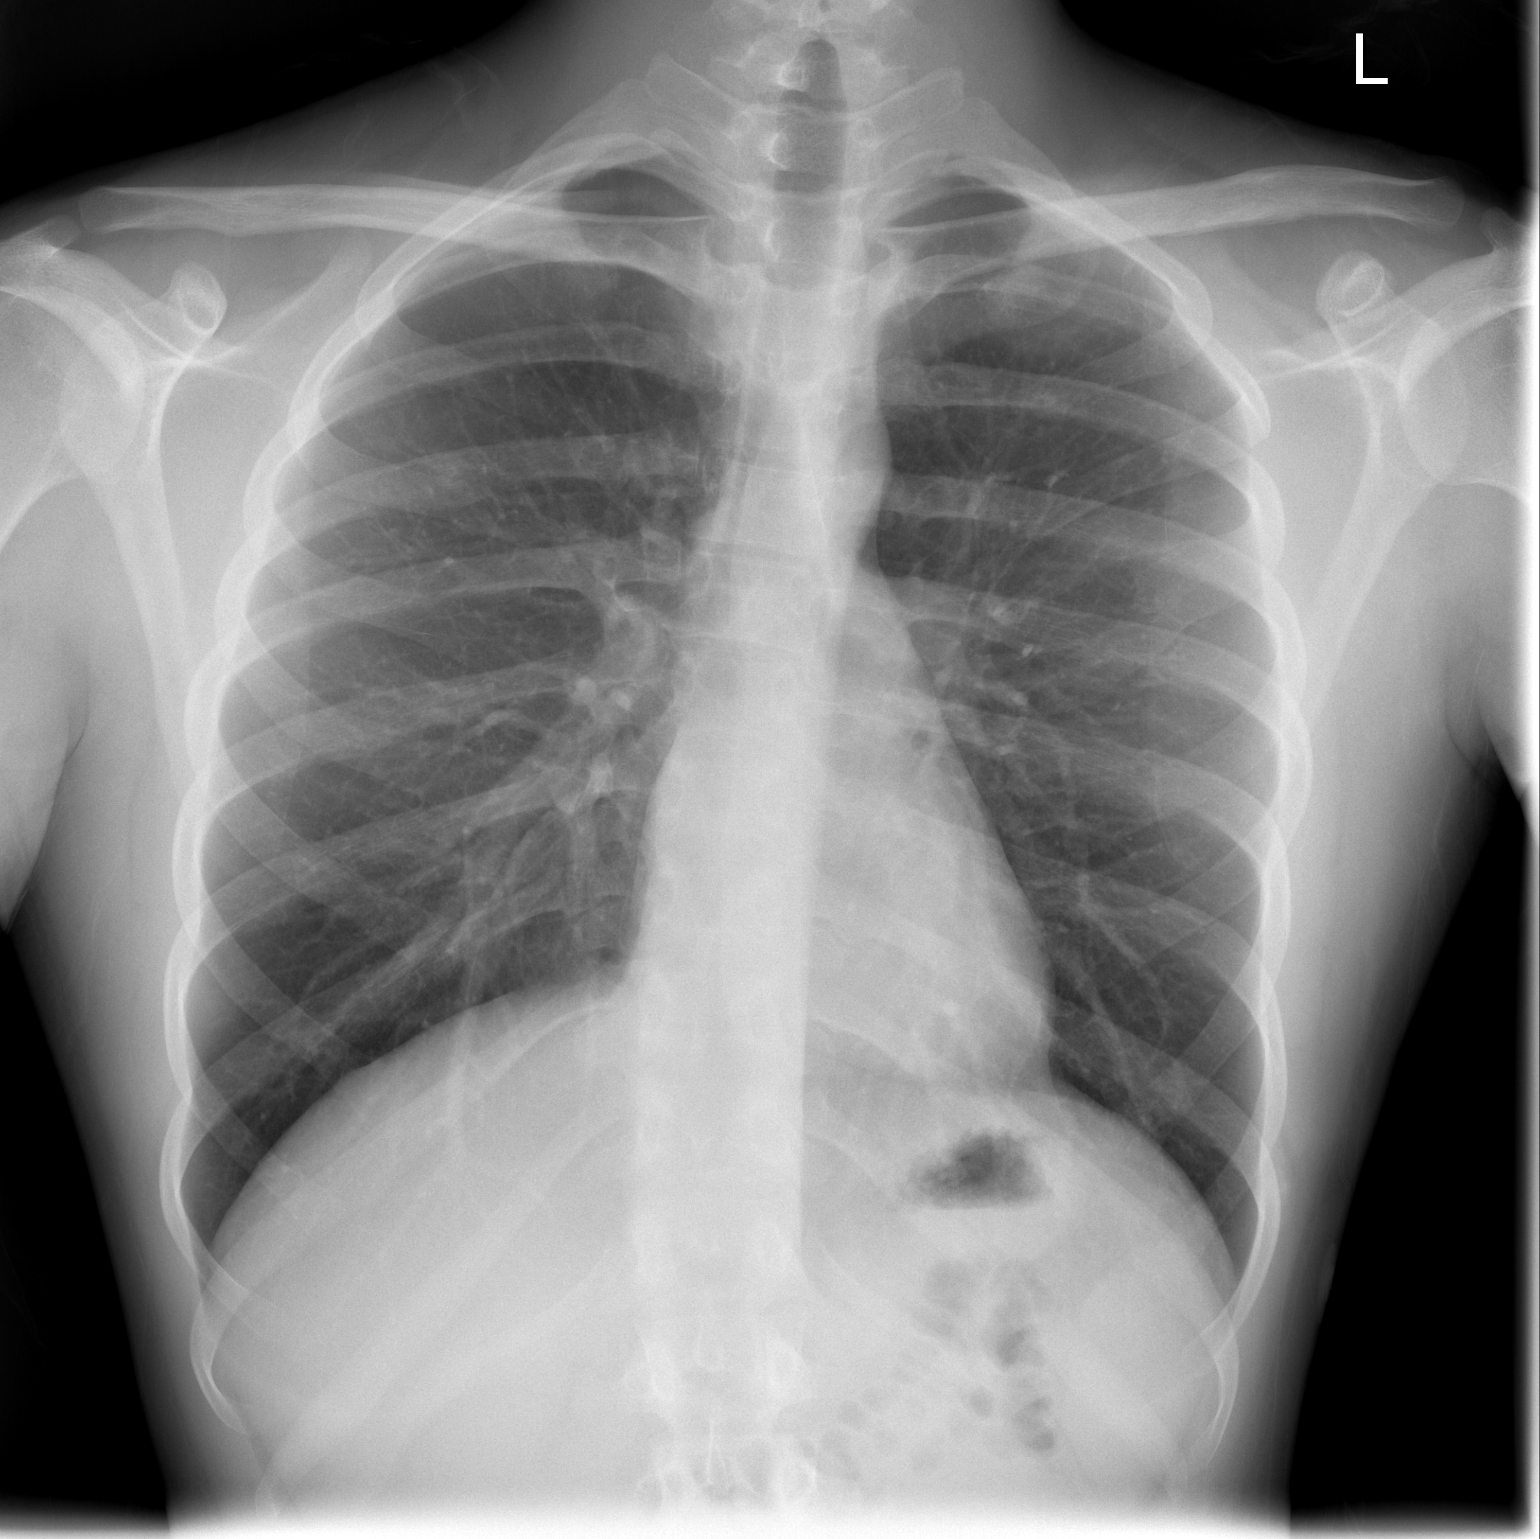

[w chest lat]
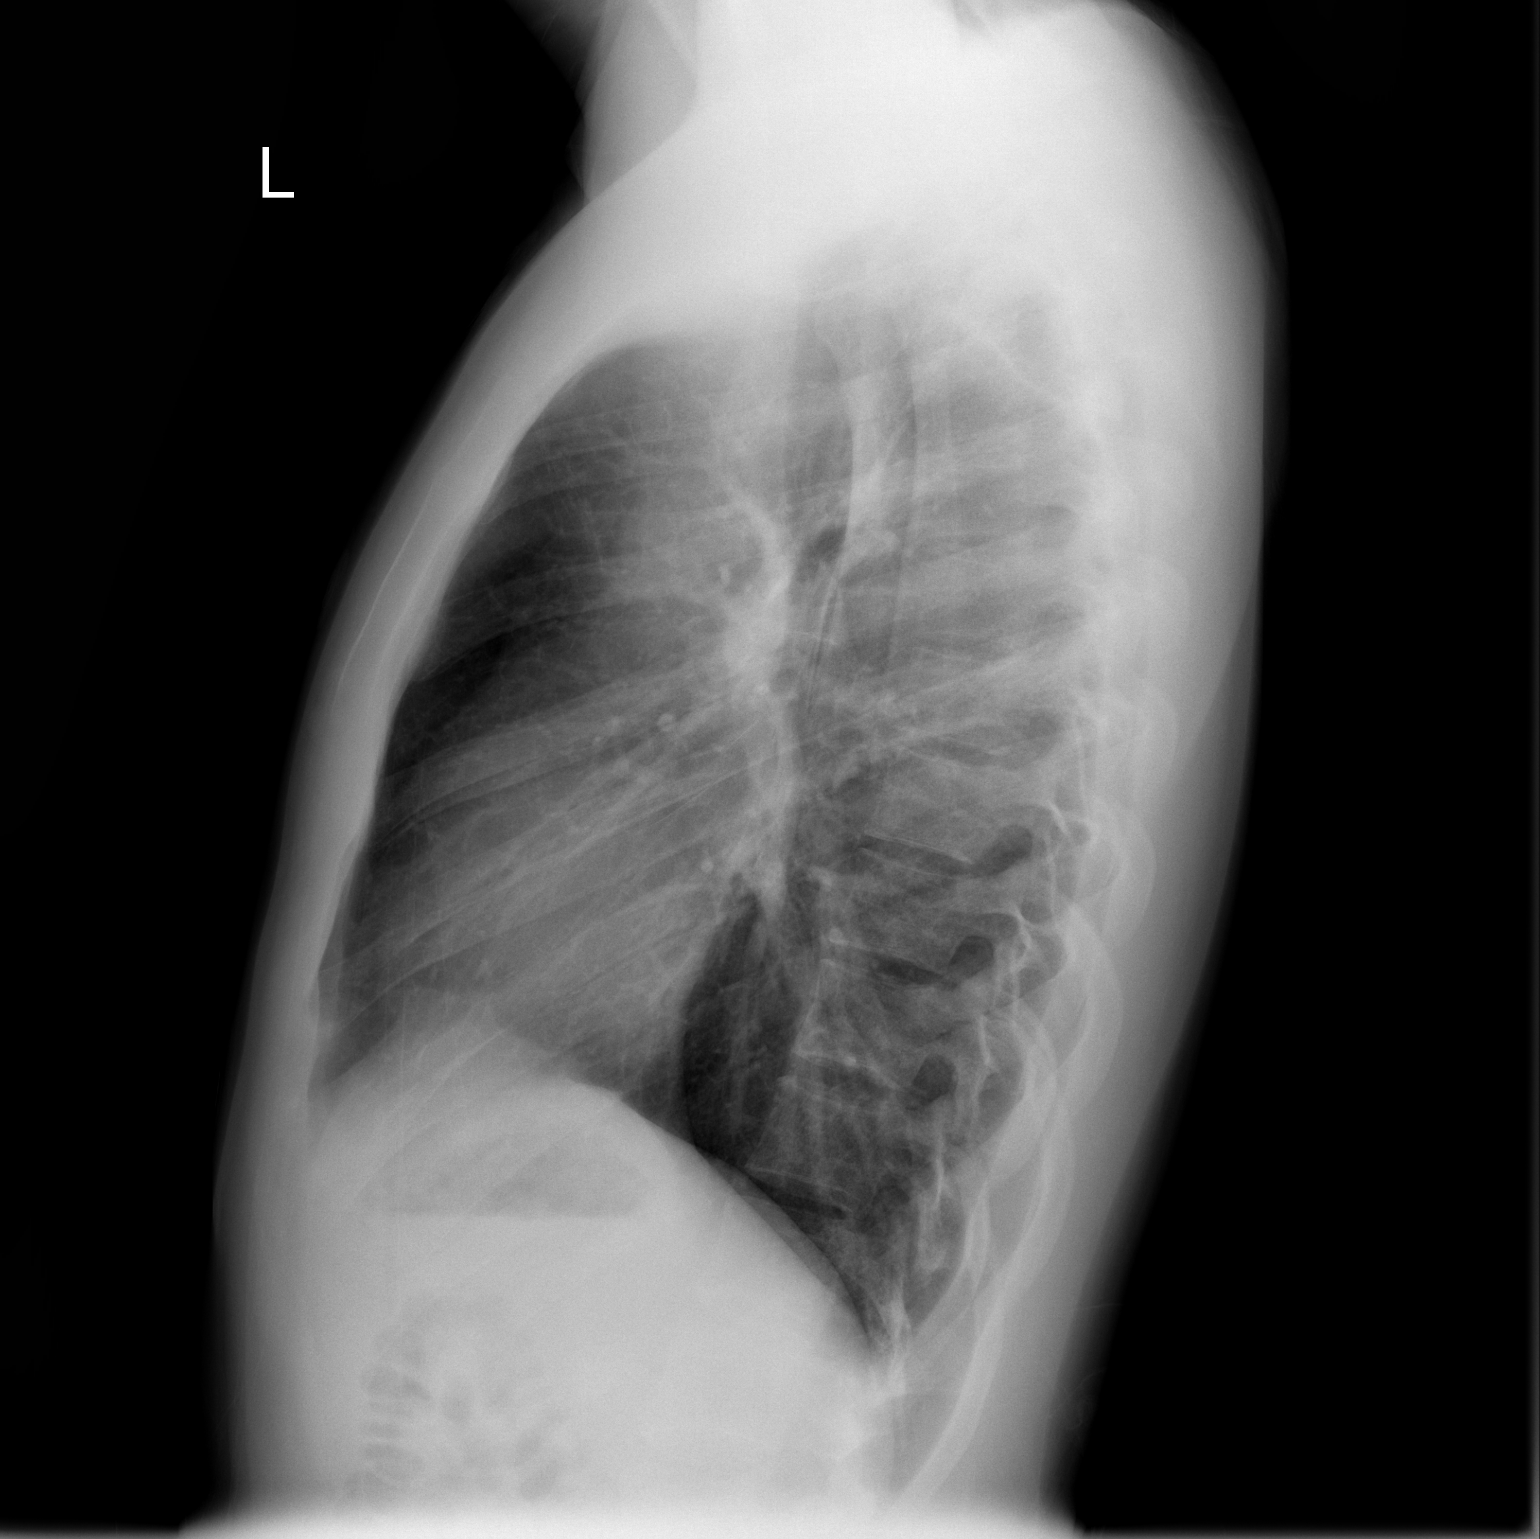

[2 of 2 positions shown; findings below may reference images not displayed]

FINDINGS: Cardiopericardial silhouette within normal limits. Mediastinal
contours normal. Trachea midline. No airspace disease or effusion.
IMPRESSION: No active cardiopulmonary disease.

## 2017-04-14 ENCOUNTER — Encounter: Payer: Self-pay | Admitting: Internal Medicine

## 2017-10-17 DIAGNOSIS — H5213 Myopia, bilateral: Secondary | ICD-10-CM | POA: Diagnosis not present

## 2017-10-23 NOTE — Progress Notes (Signed)
Chief Complaint  Patient presents with  . Annual Exam    no new concerns    HPI: Patient  James Blackwell  27 y.o. comes in today for Preventive Health Care visit  Last seen  2016 .    Needs wellness check also concern about anxiety depressive mood   Wife of 2 years left 3 weeks ago ( separated )    depressed about this and also  Gets anxiety  Worry catastrophic  No  Hallucinations delusions  Has hx of adhd.    .   Health Maintenance  Topic Date Due  . HIV Screening  08/06/2007  . TETANUS/TDAP  08/06/2011  . INFLUENZA VACCINE  02/22/2018   Health Maintenance Review LIFESTYLE:  Exercise:   Walking at work   [employer] .  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    4 years  Total work . Separation  Wife     3 weeks ago .    2 years.  Anxiety   Worry and panicky  .     ROS:  GEN/ HEENT: No fever, significant weight changes sweats headaches vision problems hearing changes, CV/ PULM; No chest pain shortness of breath cough, syncope,edema  change in exercise tolerance. GI /GU: No adominal pain, vomiting, change in bowel habits. No blood in the stool. No significant GU symptoms. SKIN/HEME: ,no acute skin rashes suspicious lesions or bleeding. No lymphadenopathy, nodules, masses.  NEURO/ PSYCH:  No neurologic signs such as weakness numbness.   IMM/ Allergy: No unusual infections.  Allergy .   REST of 12 system review negative except as per HPI   Past Medical History:  Diagnosis Date  . Chicken pox   . Depression     Past Surgical History:  Procedure Laterality Date  . CLAVICLE SURGERY Left    3-4 years ago    Family History  Problem Relation Age of Onset  . Miscarriages / [place] Mother        3 miscarriages  . Arthritis Father   . Arthritis Maternal Grandmother   . Arthritis Maternal Grandfather   . Hearing  loss Maternal Grandfather     Social History   Socioeconomic History  . Marital status: Single    Spouse name: Not on file  . Number of children: Not on file  . Years of education: Not on file  . Highest education level: Not on file  Occupational History  . Not on file  Social Needs  . Financial resource strain: Not on file  . Food insecurity:    Worry: Not on file    Inability: Not on file  . Transportation needs:    Medical: Not on file    Non-medical: Not on file  Tobacco Use  . Smoking status: Current Some Day Smoker    Types: Cigars  . Smokeless tobacco: Current User  Substance and Sexual Activity  . Alcohol use: Yes    Alcohol/week: 0.0 oz  . Drug use: No  . Sexual activity: Not on file  Lifestyle  . Physical activity:    Days per week: Not on file    Minutes per session: Not on file  . Stress: Not on file  Relationships  . Social  connections:    Talks on phone: Not on file    Gets together: Not on file    Attends religious service: Not on file    Active member of club or organization: Not on file    Attends meetings of clubs or organizations: Not on file    Relationship status: Not on file  Other Topics Concern  . Not on file  Social History Narrative   HH  Of 1 Parents    Pets dog and cat.    Shifting  From tobacco  2 per day and now   Electronic  .   Day  Shift    [address]    caffiene once a week.    [employer] engineering    Works Buyer, retail at [employer]         4 years  [place] [employer]   Lives alone recnetly separated from  Wife of 2 years     Outpatient Medications Prior to Visit  Medication Sig Dispense Refill  . ranitidine (ZANTAC) 150 MG tablet Take 1 tablet (150 mg total) by mouth 2 (two) times daily. (Patient not taking: Reported on 10/24/2017) 60 tablet 6   No facility-administered medications prior to visit.      EXAM:  BP 118/78 (BP Location: Left Arm, Patient Position: Sitting, Cuff Size: Normal)   Pulse 90   Temp 98.4 F (36.9 C)  (Oral)   Ht 5' 9.25" (1.759 m)   Wt 150 lb 8 oz (68.3 kg)   BMI 22.06 kg/m   Body mass index is 22.06 kg/m. Wt Readings from Last 3 Encounters:  10/24/17 150 lb 8 oz (68.3 kg)  12/23/14 146 lb 3.2 oz (66.3 kg)  11/12/14 147 lb 3.2 oz (66.8 kg)    Physical Exam: Vital signs reviewed JIR:CVEL is a well-developed well-nourished alert cooperative    who appearsr stated age in no acute distress.  HEENT: normocephalic atraumatic , Eyes: PERRL EOM's full, conjunctiva clear, Nares: paten,t no deformity discharge or tenderness., Ears: no deformity EAC's clear TMs with normal landmarks. Mouth: clear OP, no lesions, edema.  Moist mucous membranes. Dentition in adequate repair. NECK: supple without masses, thyromegaly or bruits. CHEST/PULM:  Clear to auscultation and percussion breath sounds equal no wheeze , rales or rhonchi. No chest wall deformities or tenderness. Breast: normal by inspection . No dimpling, discharge, masses, tenderness or discharge . CV: PMI is nondisplaced, S1 S2 no gallops, murmurs, rubs. Peripheral pulses are full without delay.No JVD .  ABDOMEN: Bowel sounds normal nontender  No guard or rebound, no hepato splenomegal no CVA tenderness.  No hernia.  gu declined  Extremtities:  No clubbing cyanosis or edema, no acute joint swelling or redness no focal atrophy NEURO:  Oriented x3, cranial nerves 3-12 appear to be intact, no obvious focal weakness,gait within normal limits no abnormal reflexes or asymmetrical SKIN: No acute rashes normal turgor, color, no bruising or petechiae. Multiple tatoos  PSYCH: Oriented, good eye contact, no obvious depression anxiety, cognition and judgment appear normal. LN: no cervical axillary inguinal adenopathy PHQ-SADS Somatic:1 GAD7:7 fear and worry   attack PHQ9:10 concentration guilt down 1 on 9   But not actively suicidal .   Difficulty : somewhat.    BP Readings from Last 3 Encounters:  10/24/17 118/78  12/23/14 118/70  11/12/14  134/72    Lab results reviewed with patient   ASSESSMENT AND PLAN:  Discussed the following assessment and plan:  Visit for preventive health examination -  Plan: CBC with Differential/Platelet, Lipid panel, TSH, CMP  Low HDL (under 40) - Plan: CBC with Differential/Platelet, Lipid panel, TSH, CMP  Situational depression - anxiety  and underlying adhd also   see text  for plan - Plan: CBC with Differential/Platelet, Lipid panel, TSH, CMP  Screening for HIV (human immunodeficiency virus) - Plan: HIV antibody  Need for Tdap vaccination - Plan: Tdap vaccine greater than or equal to 7yo IM  Anxiety state Tobacco uses some   Advise stop    Counseled. Self care   Expectant management.   Patient Care Team: Diora Bellizzi, Standley Brooking, MD as PCP - General (Internal Medicine) Rachel Moulds, DO (Internal Medicine) Patient Instructions  Will notify you  of labs when available.   Counseling advised  Regarding recent    Relationship loss.   Attention to  lifestyle  healthy eating and exercise . Sleep   To help .    Make appt with  Counselor and  After established  Can plan rov with me   Or   if  Medication  considered  Or  Other ways we can help.   tdap today      Preventive Care for [place], Male The transition to life after high school as a young adult can be a stressful time with many changes. You may start seeing a primary care physician instead of a pediatrician. This is the time when your health care becomes your responsibility. Preventive care refers to lifestyle choices and visits with your health care provider that can promote health and wellness. What does preventive care include?  A yearly physical exam. This is also called an annual wellness visit.  Dental exams once or twice a year.  Routine eye exams. Ask your health care provider how often you should have your eyes checked.  Personal lifestyle choices, including: ? Daily care of your teeth and gums. ? Regular physical  activity. ? Eating a healthy diet. ? Avoiding tobacco and drug use. ? Avoiding or limiting alcohol use. ? Practicing safe sex. What happens during an annual wellness visit? Preventive care starts with a yearly visit to your primary care physician. The services and screenings done by your health care provider during your annual wellness visit will depend on your overall health, lifestyle risk factors, and family history of disease. Counseling Your health care provider may ask you questions about:  Past medical problems and your family's medical history.  Medicines or supplements that you take.  Health insurance and access to health care.  Alcohol, tobacco, and drug use, including use of any bodybuilding drugs (anabolic steroids).  Your safety at home, work, or school.  Access to firearms.  Emotional well-being and how you cope with stress.  Relationship well-being.  Diet, exercise, and sleep habits.  Your sexual health and activity.  Screening You may have the following tests or measurements:  Height, weight, and BMI.  Blood pressure.  Lipid and cholesterol levels.  Tuberculosis skin test.  Skin exam.  Vision and hearing tests.  Genital exam to check for testicular cancer or hernias.  Screening test for hepatitis.  Screening tests for STDs (sexually transmitted diseases), if you are at risk.  Vaccines Your health care provider may recommend certain vaccines, such as:  Influenza vaccine. This is recommended every year.  Tetanus, diphtheria, and acellular pertussis (Tdap, Td) vaccine. You may need a Td booster every 10 years.  Varicella vaccine. You may need this if you have not been vaccinated.  HPV vaccine. If  you are 8 or younger, you may need three doses over 6 months.  Measles, mumps, and rubella (MMR) vaccine. You may need at least one dose of MMR. You may also need a second dose.  Pneumococcal 13-valent conjugate (PCV13) vaccine. You may need this  if you have certain conditions and have not been vaccinated.  Pneumococcal polysaccharide (PPSV23) vaccine. You may need one or two doses if you smoke cigarettes or if you have certain conditions.  Meningococcal vaccine. One dose is recommended if you are age 35-21 years and a first-year college student living in a residence hall, or if you have one of several medical conditions. You may also need additional booster doses.  Hepatitis A vaccine. You may need this if you have certain conditions or if you travel or work in places where you may be exposed to hepatitis A.  Hepatitis B vaccine. You may need this if you have certain conditions or if you travel or work in places where you may be exposed to hepatitis B.  Haemophilus influenzae type b (Hib) vaccine. You may need this if you have certain risk factors.  Talk to your health care provider about which screenings and vaccines you need and how often you need them. What steps can I take to develop healthy behaviors?  Have regular preventive health care visits with your primary care physician and dentist.  Eat a healthy diet.  Drink enough fluid to keep your urine clear or pale yellow.  Stay active. Exercise at least 30 minutes 5 or more days of the week.  Use alcohol responsibly.  Maintain a healthy weight.  Do not use any products that contain nicotine, such as cigarettes, chewing tobacco, and e-cigarettes. If you need help quitting, ask your health care provider.  Do not use drugs.  Practice safe sex. This includes using condoms to prevent STDs or an unwanted pregnancy.  Find healthy ways to manage stress. How can I protect myself from injury? Injuries from violence or accidents are the leading cause of death among young adults and can often be prevented. Take these steps to help protect yourself:  Always wear your seat belt while driving or riding in a vehicle.  Do not drive if you have been drinking alcohol. Do not ride with  someone who has been drinking.  Do not drive when you are tired or distracted. Do not text while driving.  Wear a helmet and other protective equipment during sports activities.  If you have firearms in your house, make sure you follow all gun safety procedures.  Seek help if you have been bullied, physically abused, or sexually abused.  Avoid fighting.  Use the Internet responsibly to avoid dangers such as online bullying.  What can I do to cope with stress? Young adults may face many new challenges that can be stressful, such as finding a job, going to college, moving away from home, managing money, being in a relationship, getting married, and having children. To manage stress:  Avoid known stressful situations when you can.  Exercise regularly.  Find a stress-reducing activity that works best for you. Examples include meditation, yoga, listening to music, or reading.  Spend time in nature.  Keep a journal to write about your stress and how you respond.  Talk to your health care provider about stress. He or she may suggest counseling.  Spend time with supportive friends or family.  Do not cope with stress by: ? Drinking alcohol or using drugs. ? Smoking cigarettes. ?  Eating.  Where can I get more information? Learn more about preventive care and healthy habits from:  U.S. Preventive Services Task Force: StageSync.si  National Adolescent and [place]: StrategicRoad.nl  American Academy of Pediatrics Bright Futures: https://brightfutures.MemberVerification.co.za  Society for Adolescent Health and Medicine: MoralBlog.co.za.aspx  PodExchange.nl: ToyLending.fr  This information is not intended to replace advice given to you by your health care  provider. Make sure you discuss any questions you have with your health care provider. Document Released: 11/26/2015 Document Revised: 12/17/2015 Document Reviewed: 11/26/2015 Elsevier Interactive Patient Education  2018 [employer] After being diagnosed with an anxiety disorder, you may be relieved to know why you have felt or behaved a certain way. It is natural to also feel overwhelmed about the treatment ahead and what it will mean for your life. With care and support, you can manage this condition and recover from it. How to cope with anxiety Dealing with stress Stress is your body's reaction to life changes and events, both good and bad. Stress can last just a few hours or it can be ongoing. Stress can play a major role in anxiety, so it is important to learn both how to cope with stress and how to think about it differently. Talk with your health care provider or a counselor to learn more about stress reduction. He or she may suggest some stress reduction techniques, such as:  Music therapy. This can include creating or listening to music that you enjoy and that inspires you.  Mindfulness-based meditation. This involves being aware of your normal breaths, rather than trying to control your breathing. It can be done while sitting or walking.  Centering prayer. This is a kind of meditation that involves focusing on a word, phrase, or sacred image that is meaningful to you and that brings you peace.  Deep breathing. To do this, expand your stomach and inhale slowly through your nose. Hold your breath for 3-5 seconds. Then exhale slowly, allowing your stomach muscles to relax.  Self-talk. This is a skill where you identify thought patterns that lead to anxiety reactions and correct those thoughts.  Muscle relaxation. This involves tensing muscles then relaxing them.  Choose a stress reduction technique that fits your lifestyle and personality. Stress reduction  techniques take time and practice. Set aside 5-15 minutes a day to do them. Therapists can offer training in these techniques. The training may be covered by some insurance plans. Other things you can do to manage stress include:  Keeping a stress diary. This can help you learn what triggers your stress and ways to control your response.  Thinking about how you respond to certain situations. You may not be able to control everything, but you can control your reaction.  Making time for activities that help you relax, and not feeling guilty about spending your time in this way.  Therapy combined with coping and stress-reduction skills provides the best chance for successful treatment. Medicines Medicines can help ease symptoms. Medicines for anxiety include:  Anti-anxiety drugs.  Antidepressants.  Beta-blockers.  Medicines may be used as the main treatment for anxiety disorder, along with therapy, or if other treatments are not working. Medicines should be prescribed by a health care provider. Relationships Relationships can play a big part in helping you recover. Try to spend more time connecting with trusted friends and family members. Consider going to couples counseling, taking family education classes, or going to family therapy. Therapy  can help you and others better understand the condition. How to recognize changes in your condition Everyone has a different response to treatment for anxiety. Recovery from anxiety happens when symptoms decrease and stop interfering with your daily activities at home or work. This may mean that you will start to:  Have better concentration and focus.  Sleep better.  Be less irritable.  Have more energy.  Have improved memory.  It is important to recognize when your condition is getting worse. Contact your health care provider if your symptoms interfere with home or work and you do not feel like your condition is improving. Where to find help  and support: You can get help and support from these sources:  Self-help groups.  Online and [employer].  A trusted spiritual leader.  Couples counseling.  Family education classes.  Family therapy.  Follow these instructions at home:  Eat a healthy diet that includes plenty of vegetables, fruits, whole grains, low-fat dairy products, and lean protein. Do not eat a lot of foods that are high in solid fats, added sugars, or salt.  Exercise. Most adults should do the following: ? Exercise for at least 150 minutes each week. The exercise should increase your heart rate and make you sweat (moderate-intensity exercise). ? Strengthening exercises at least twice a week.  Cut down on caffeine, tobacco, alcohol, and other potentially harmful substances.  Get the right amount and quality of sleep. Most adults need 7-9 hours of sleep each night.  Make choices that simplify your life.  Take over-the-counter and prescription medicines only as told by your health care provider.  Avoid caffeine, alcohol, and certain over-the-counter cold medicines. These may make you feel worse. Ask your pharmacist which medicines to avoid.  Keep all follow-up visits as told by your health care provider. This is important. Questions to ask your health care provider  Would I benefit from therapy?  How often should I follow up with a health care provider?  How long do I need to take medicine?  Are there any long-term side effects of my medicine?  Are there any alternatives to taking medicine? Contact a health care provider if:  You have a hard time staying focused or finishing daily tasks.  You spend many hours a day feeling worried about everyday life.  You become exhausted by worry.  You start to have headaches, feel tense, or have nausea.  You urinate more than normal.  You have diarrhea. Get help right away if:  You have a racing heart and shortness of breath.  You have  thoughts of hurting yourself or others. If you ever feel like you may hurt yourself or others, or have thoughts about taking your own life, get help right away. You can go to your nearest emergency department or call:  Your local emergency services (911 in the [place]  A suicide crisis helpline, such as the [facility] at 361-483-8990. This is open 24-hours a day.  Summary  Taking steps to deal with stress can help calm you.  Medicines cannot cure anxiety disorders, but they can help ease symptoms.  Family, friends, and partners can play a big part in helping you recover from an anxiety disorder. This information is not intended to replace advice given to you by your health care provider. Make sure you discuss any questions you have with your health care provider. Document Released: 07/05/2016 Document Revised: 07/05/2016 Document Reviewed: 07/05/2016 Elsevier Interactive Patient Education  [employer].  Standley Brooking. Claxton Levitz M.D.

## 2017-10-24 ENCOUNTER — Encounter: Payer: Self-pay | Admitting: Internal Medicine

## 2017-10-24 ENCOUNTER — Ambulatory Visit (INDEPENDENT_AMBULATORY_CARE_PROVIDER_SITE_OTHER): Payer: 59 | Admitting: Internal Medicine

## 2017-10-24 VITALS — BP 118/78 | HR 90 | Temp 98.4°F | Ht 69.25 in | Wt 150.5 lb

## 2017-10-24 DIAGNOSIS — E786 Lipoprotein deficiency: Secondary | ICD-10-CM

## 2017-10-24 DIAGNOSIS — F411 Generalized anxiety disorder: Secondary | ICD-10-CM

## 2017-10-24 DIAGNOSIS — Z23 Encounter for immunization: Secondary | ICD-10-CM | POA: Diagnosis not present

## 2017-10-24 DIAGNOSIS — Z0000 Encounter for general adult medical examination without abnormal findings: Secondary | ICD-10-CM | POA: Diagnosis not present

## 2017-10-24 DIAGNOSIS — Z114 Encounter for screening for human immunodeficiency virus [HIV]: Secondary | ICD-10-CM

## 2017-10-24 DIAGNOSIS — F4321 Adjustment disorder with depressed mood: Secondary | ICD-10-CM | POA: Diagnosis not present

## 2017-10-24 NOTE — Patient Instructions (Addendum)
Will notify you  of labs when available.   Counseling advised  Regarding recent    Relationship loss.   Attention to  lifestyle  healthy eating and exercise . Sleep   To help .    Make appt with  Counselor and  After established  Can plan rov with me   Or   if  Medication  considered  Or  Other ways we can help.   tdap today      Preventive Care for James Blackwell, Male The transition to life after high school as a young adult can be a stressful time with many changes. You may start seeing a primary care physician instead of a pediatrician. This is the time when your health care becomes your responsibility. Preventive care refers to lifestyle choices and visits with your health care provider that can promote health and wellness. What does preventive care include?  A yearly physical exam. This is also called an annual wellness visit.  Dental exams once or twice a year.  Routine eye exams. Ask your health care provider how often you should have your eyes checked.  Personal lifestyle choices, including: ? Daily care of your teeth and gums. ? Regular physical activity. ? Eating a healthy diet. ? Avoiding tobacco and drug use. ? Avoiding or limiting alcohol use. ? Practicing safe sex. What happens during an annual wellness visit? Preventive care starts with a yearly visit to your primary care physician. The services and screenings done by your health care provider during your annual wellness visit will depend on your overall health, lifestyle risk factors, and family history of disease. Counseling Your health care provider may ask you questions about:  Past medical problems and your family's medical history.  Medicines or supplements that you take.  Health insurance and access to health care.  Alcohol, tobacco, and drug use, including use of any bodybuilding drugs (anabolic steroids).  Your safety at home, work, or school.  Access to firearms.  Emotional well-being and how  you cope with stress.  Relationship well-being.  Diet, exercise, and sleep habits.  Your sexual health and activity.  Screening You may have the following tests or measurements:  Height, weight, and BMI.  Blood pressure.  Lipid and cholesterol levels.  Tuberculosis skin test.  Skin exam.  Vision and hearing tests.  Genital exam to check for testicular cancer or hernias.  Screening test for hepatitis.  Screening tests for STDs (sexually transmitted diseases), if you are at risk.  Vaccines Your health care provider may recommend certain vaccines, such as:  Influenza vaccine. This is recommended every year.  Tetanus, diphtheria, and acellular pertussis (Tdap, Td) vaccine. You may need a Td booster every 10 years.  Varicella vaccine. You may need this if you have not been vaccinated.  HPV vaccine. If you are 83 or younger, you may need three doses over 6 months.  Measles, mumps, and rubella (MMR) vaccine. You may need at least one dose of MMR. You may also need a second dose.  Pneumococcal 13-valent conjugate (PCV13) vaccine. You may need this if you have certain conditions and have not been vaccinated.  Pneumococcal polysaccharide (PPSV23) vaccine. You may need one or two doses if you smoke cigarettes or if you have certain conditions.  Meningococcal vaccine. One dose is recommended if you are age 42-21 years and a first-year college student living in a residence hall, or if you have one of several medical conditions. You may also need additional booster doses.  Hepatitis A vaccine. You may need this if you have certain conditions or if you travel or work in places where you may be exposed to hepatitis A.  Hepatitis B vaccine. You may need this if you have certain conditions or if you travel or work in places where you may be exposed to hepatitis B.  Haemophilus influenzae type b (Hib) vaccine. You may need this if you have certain risk factors.  Talk to your health  care provider about which screenings and vaccines you need and how often you need them. What steps can I take to develop healthy behaviors?  Have regular preventive health care visits with your primary care physician and dentist.  Eat a healthy diet.  Drink enough fluid to keep your urine clear or pale yellow.  Stay active. Exercise at least 30 minutes 5 or more days of the week.  Use alcohol responsibly.  Maintain a healthy weight.  Do not use any products that contain nicotine, such as cigarettes, chewing tobacco, and e-cigarettes. If you need help quitting, ask your health care provider.  Do not use drugs.  Practice safe sex. This includes using condoms to prevent STDs or an unwanted pregnancy.  Find healthy ways to manage stress. How can I protect myself from injury? Injuries from violence or accidents are the leading cause of death among young adults and can often be prevented. Take these steps to help protect yourself:  Always wear your seat belt while driving or riding in a vehicle.  Do not drive if you have been drinking alcohol. Do not ride with someone who has been drinking.  Do not drive when you are tired or distracted. Do not text while driving.  Wear a helmet and other protective equipment during sports activities.  If you have firearms in your house, make sure you follow all gun safety procedures.  Seek help if you have been bullied, physically abused, or sexually abused.  Avoid fighting.  Use the Internet responsibly to avoid dangers such as online bullying.  What can I do to cope with stress? Young adults may face many new challenges that can be stressful, such as finding a job, going to college, moving away from home, managing money, being in a relationship, getting married, and having children. To manage stress:  Avoid known stressful situations when you can.  Exercise regularly.  Find a stress-reducing activity that works best for you. Examples  include meditation, yoga, listening to music, or reading.  Spend time in nature.  Keep a journal to write about your stress and how you respond.  Talk to your health care provider about stress. He or she may suggest counseling.  Spend time with supportive friends or family.  Do not cope with stress by: ? Drinking alcohol or using drugs. ? Smoking cigarettes. ? Eating.  Where can I get more information? Learn more about preventive care and healthy habits from:  [place]. Preventive Services Task Force: StageSync.si  National Adolescent and [place]: StrategicRoad.nl  American Academy of Pediatrics Bright Futures: https://brightfutures.MemberVerification.co.za  Society for Adolescent Health and Medicine: MoralBlog.co.za.aspx  PodExchange.nl: ToyLending.fr  This information is not intended to replace advice given to you by your health care provider. Make sure you discuss any questions you have with your health care provider. Document Released: 11/26/2015 Document Revised: 12/17/2015 Document Reviewed: 11/26/2015 Elsevier Interactive Patient Education  2018 [place] After being diagnosed with an anxiety disorder, you may be relieved  to know why you have felt or behaved a certain way. It is natural to also feel overwhelmed about the treatment ahead and what it will mean for your life. With care and support, you can manage this condition and recover from it. How to cope with anxiety Dealing with stress Stress is your body's reaction to life changes and events, both good and bad. Stress can last just a few hours or it can be ongoing. Stress can play a major role in anxiety, so it is important to learn both how to cope with stress and  how to think about it differently. Talk with your health care provider or a counselor to learn more about stress reduction. He or she may suggest some stress reduction techniques, such as:  Music therapy. This can include creating or listening to music that you enjoy and that inspires you.  Mindfulness-based meditation. This involves being aware of your normal breaths, rather than trying to control your breathing. It can be done while sitting or walking.  Centering prayer. This is a kind of meditation that involves focusing on a word, phrase, or sacred image that is meaningful to you and that brings you peace.  Deep breathing. To do this, expand your stomach and inhale slowly through your nose. Hold your breath for 3-5 seconds. Then exhale slowly, allowing your stomach muscles to relax.  Self-talk. This is a skill where you identify thought patterns that lead to anxiety reactions and correct those thoughts.  Muscle relaxation. This involves tensing muscles then relaxing them.  Choose a stress reduction technique that fits your lifestyle and personality. Stress reduction techniques take time and practice. Set aside 5-15 minutes a day to do them. Therapists can offer training in these techniques. The training may be covered by some insurance plans. Other things you can do to manage stress include:  Keeping a stress diary. This can help you learn what triggers your stress and ways to control your response.  Thinking about how you respond to certain situations. You may not be able to control everything, but you can control your reaction.  Making time for activities that help you relax, and not feeling guilty about spending your time in this way.  Therapy combined with coping and stress-reduction skills provides the best chance for successful treatment. Medicines Medicines can help ease symptoms. Medicines for anxiety include:  Anti-anxiety  drugs.  Antidepressants.  Beta-blockers.  Medicines may be used as the main treatment for anxiety disorder, along with therapy, or if other treatments are not working. Medicines should be prescribed by a health care provider. Relationships Relationships can play a big part in helping you recover. Try to spend more time connecting with trusted friends and family members. Consider going to couples counseling, taking family education classes, or going to family therapy. Therapy can help you and others better understand the condition. How to recognize changes in your condition Everyone has a different response to treatment for anxiety. Recovery from anxiety happens when symptoms decrease and stop interfering with your daily activities at home or work. This may mean that you will start to:  Have better concentration and focus.  Sleep better.  Be less irritable.  Have more energy.  Have improved memory.  It is important to recognize when your condition is getting worse. Contact your health care provider if your symptoms interfere with home or work and you do not feel like your condition is improving. Where to find help and support: You can get help and  support from these sources:  Self-help groups.  Online and [employer].  A trusted spiritual leader.  Couples counseling.  Family education classes.  Family therapy.  Follow these instructions at home:  Eat a healthy diet that includes plenty of vegetables, fruits, whole grains, low-fat dairy products, and lean protein. Do not eat a lot of foods that are high in solid fats, added sugars, or salt.  Exercise. Most adults should do the following: ? Exercise for at least 150 minutes each week. The exercise should increase your heart rate and make you sweat (moderate-intensity exercise). ? Strengthening exercises at least twice a week.  Cut down on caffeine, tobacco, alcohol, and other potentially harmful  substances.  Get the right amount and quality of sleep. Most adults need 7-9 hours of sleep each night.  Make choices that simplify your life.  Take over-the-counter and prescription medicines only as told by your health care provider.  Avoid caffeine, alcohol, and certain over-the-counter cold medicines. These may make you feel worse. Ask your pharmacist which medicines to avoid.  Keep all follow-up visits as told by your health care provider. This is important. Questions to ask your health care provider  Would I benefit from therapy?  How often should I follow up with a health care provider?  How long do I need to take medicine?  Are there any long-term side effects of my medicine?  Are there any alternatives to taking medicine? Contact a health care provider if:  You have a hard time staying focused or finishing daily tasks.  You spend many hours a day feeling worried about everyday life.  You become exhausted by worry.  You start to have headaches, feel tense, or have nausea.  You urinate more than normal.  You have diarrhea. Get help right away if:  You have a racing heart and shortness of breath.  You have thoughts of hurting yourself or others. If you ever feel like you may hurt yourself or others, or have thoughts about taking your own life, get help right away. You can go to your nearest emergency department or call:  Your local emergency services (911 in the [place]  A suicide crisis helpline, such as the [facility] at 434-474-7643. This is open 24-hours a day.  Summary  Taking steps to deal with stress can help calm you.  Medicines cannot cure anxiety disorders, but they can help ease symptoms.  Family, friends, and partners can play a big part in helping you recover from an anxiety disorder. This information is not intended to replace advice given to you by your health care provider. Make sure you discuss any questions you  have with your health care provider. Document Released: 07/05/2016 Document Revised: 07/05/2016 Document Reviewed: 07/05/2016 Elsevier Interactive Patient Education  [employer].

## 2017-10-25 LAB — CBC WITH DIFFERENTIAL/PLATELET
Basophils Absolute: 0 10*3/uL (ref 0.0–0.1)
Basophils Relative: 0.6 % (ref 0.0–3.0)
Eosinophils Absolute: 0 10*3/uL (ref 0.0–0.7)
Eosinophils Relative: 0.3 % (ref 0.0–5.0)
HCT: 46.9 % (ref 39.0–52.0)
Hemoglobin: 16.2 g/dL (ref 13.0–17.0)
Lymphocytes Relative: 23.7 % (ref 12.0–46.0)
Lymphs Abs: 1.6 10*3/uL (ref 0.7–4.0)
MCHC: 34.6 g/dL (ref 30.0–36.0)
MCV: 90.1 fl (ref 78.0–100.0)
Monocytes Absolute: 0.5 10*3/uL (ref 0.1–1.0)
Monocytes Relative: 7.9 % (ref 3.0–12.0)
Neutro Abs: 4.4 10*3/uL (ref 1.4–7.7)
Neutrophils Relative %: 67.5 % (ref 43.0–77.0)
Platelets: 220 10*3/uL (ref 150.0–400.0)
RBC: 5.21 Mil/uL (ref 4.22–5.81)
RDW: 12.6 % (ref 11.5–15.5)
WBC: 6.6 10*3/uL (ref 4.0–10.5)

## 2017-10-25 LAB — COMPREHENSIVE METABOLIC PANEL
ALT: 26 U/L (ref 0–53)
AST: 24 U/L (ref 0–37)
Albumin: 4.7 g/dL (ref 3.5–5.2)
Alkaline Phosphatase: 87 U/L (ref 39–117)
BUN: 10 mg/dL (ref 6–23)
CO2: 31 mEq/L (ref 19–32)
Calcium: 9.8 mg/dL (ref 8.4–10.5)
Chloride: 99 mEq/L (ref 96–112)
Creatinine, Ser: 0.83 mg/dL (ref 0.40–1.50)
GFR: 119.77 mL/min (ref >60.00)
Glucose, Bld: 78 mg/dL (ref 70–99)
Potassium: 4.4 mEq/L (ref 3.5–5.1)
Sodium: 138 mEq/L (ref 135–145)
Total Bilirubin: 1.1 mg/dL (ref 0.2–1.2)
Total Protein: 7.3 g/dL (ref 6.0–8.3)

## 2017-10-25 LAB — TSH: TSH: 1.3 u[IU]/mL (ref 0.35–4.50)

## 2017-10-25 LAB — HIV ANTIBODY (ROUTINE TESTING W REFLEX): HIV 1&2 Ab, 4th Generation: NONREACTIVE

## 2017-10-25 LAB — LIPID PANEL
Cholesterol: 129 mg/dL (ref 0–200)
HDL: 48.4 mg/dL (ref >39.00)
LDL Cholesterol: 72 mg/dL (ref 0–99)
NonHDL: 80.99
Total CHOL/HDL Ratio: 3
Triglycerides: 47 mg/dL (ref 0.0–149.0)
VLDL: 9.4 mg/dL (ref 0.0–40.0)

## 2019-04-29 NOTE — Progress Notes (Signed)
Chief Complaint  Patient presents with  . Annual Exam    Pt has no concerns     HPI: Patient  James Blackwell  28 y.o. comes in today for Preventive Health Care visit   Health Maintenance  Topic Date Due  . INFLUENZA VACCINE  02/23/2019  . TETANUS/TDAP  10/25/2027  . HIV Screening  Completed   Health Maintenance Review LIFESTYLE:  Exercise:   Active  Work walking  Tobacco/ETS:not 2 x per month  Alcohol:  Not really  Sugar beverages: no Sleep: about 6- 8  Drug use: no HH of  2   3 cats and 1  Dog.  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        Loves his job  Good work involvement  Naval architect related   Anxiety    About  Heart attacks      Self medicating for cbd.   With some help and talks himself down         ROS:  GEN/ HEENT: No fever, significant weight changes sweats headaches vision problems hearing changes, CV/ PULM; No chest pain shortness of breath cough, syncope,edema  change in exercise tolerance. GI /GU: No adominal pain, vomiting, change in bowel habits. No blood in the stool. No significant GU symptoms. SKIN/HEME: ,no acute skin rashes suspicious lesions or bleeding. No lymphadenopathy, nodules, masses.  NEURO/ PSYCH:  No neurologic signs such as weakness numbness. No depression anxiety. IMM/ Allergy: No unusual infections.  Allergy .   REST of 12 system review negative except as per HPI   Past Medical History:  Diagnosis Date  . Chicken pox   . Depression     Past Surgical History:  Procedure Laterality Date  . CLAVICLE SURGERY Left    3-4 years ago    Family History  Problem Relation Age of Onset  . Miscarriages / [place] Mother        3 miscarriages  . Arthritis Father   . Arthritis Maternal Grandmother   . Arthritis Maternal Grandfather   . Hearing loss Maternal Grandfather     Social History   Socioeconomic History  . Marital status: Single    Spouse name: Not on file  . Number of children: Not on file  .  Years of education: Not on file  . Highest education level: Not on file  Occupational History  . Not on file  Social Needs  . Financial resource strain: Not on file  . Food insecurity    Worry: Not on file    Inability: Not on file  . Transportation needs    Medical: Not on file    Non-medical: Not on file  Tobacco Use  . Smoking status: Current Some Day Smoker    Types: Cigars  . Smokeless tobacco: Current User  Substance and Sexual Activity  . Alcohol use: Yes    Alcohol/week: 0.0 standard drinks  . Drug use: No  . Sexual activity: Not on file  Lifestyle  . Physical activity    Days per week: Not on file    Minutes per session: Not on file  . Stress: Not on file  Relationships  . Social Musician on phone: Not on file    Gets together: Not on file    Attends religious service: Not on file    Active member of club or organization: Not on file    Attends meetings of clubs or  organizations: Not on file    Relationship status: Not on file  Other Topics Concern  . Not on file  Social History Narrative   HH  Of 2 married    Pets dog and cat.    Shifting  From tobacco  2 per day and now   Electronic  .   Day  Shift  Rare use now    Ng rd    caffiene once a week.    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 ranitidine (ZANTAC) 150 MG tablet Take 1 tablet (150 mg total) by mouth 2 (two) times daily. (Patient not taking: Reported on 10/24/2017) 60 tablet 6   No facility-administered medications prior to visit.      EXAM:  BP 120/70 (BP Location: Right Arm, Patient Position: Sitting, Cuff Size: Normal)   Pulse 65   Temp 97.7 F (36.5 C) (Temporal)   Ht 5\' 9"  (1.753 m)   Wt 144 lb 6.4 oz (65.5 kg)   SpO2 99%   BMI 21.32 kg/m   Body mass index is 21.32 kg/m. Wt Readings from Last 3 Encounters:  04/30/19 144 lb 6.4 oz  (65.5 kg)  10/24/17 150 lb 8 oz (68.3 kg)  12/23/14 146 lb 3.2 oz (66.3 kg)    Physical Exam: Vital signs reviewed [id number] is a well-developed well-nourished alert cooperative    who appearsr stated age in no acute distress.  HEENT: normocephalic atraumatic , Eyes: PERRL EOM's full, conjunctiva clear, Nares: paten,t no deformity discharge or tenderness., Ears: no deformity EAC's clear TMs with normal landmarks. Mouth: clear OP,masked NECK: supple without masses, thyromegaly or bruits. CHEST/PULM:  Clear to auscultation and percussion breath sounds equal no wheeze , rales or rhonchi. No chest wall deformities or tenderness.  CV: PMI is nondisplaced, S1 S2 no gallops, murmurs, rubs. Peripheral pulses are full without delay.No JVD .  ABDOMEN: Bowel sounds normal nontender  No guard or rebound, no hepato splenomegal no CVA tenderness.  No hernia. Extremtities:  No clubbing cyanosis or edema, no acute joint swelling or redness no focal atrophy well healed scar left clavicle  NEURO:  Oriented x3, cranial nerves 3-12 appear to be intact, no obvious focal weakness,gait within normal limits no abnormal reflexes or asymmetrical SKIN: No acute rashes normal turgor, color, no bruising or petechiae. Multiple tatoos  PSYCH: Oriented, good eye contact, no obvious depression anxiety, cognition and judgment appear normal. LN: no cervical axillary inguinal adenopathy  Lab Results  Component Value Date   WBC 6.6 10/24/2017   HGB 16.2 10/24/2017   HCT 46.9 10/24/2017   PLT 220.0 10/24/2017   GLUCOSE 78 10/24/2017   CHOL 129 10/24/2017   TRIG 47.0 10/24/2017   HDL 48.40 10/24/2017   LDLCALC 72 10/24/2017   ALT 26 10/24/2017   AST 24 10/24/2017   NA 138 10/24/2017   K 4.4 10/24/2017   CL 99 10/24/2017   CREATININE 0.83 10/24/2017   BUN 10 10/24/2017   CO2 31 10/24/2017   TSH 1.30 10/24/2017   INR 1.01 03/08/2010    BP Readings from Last 3 Encounters:  04/30/19 120/70  10/24/17 118/78   12/23/14 118/70    Lab results from 4 19  reviewed with patient   ASSESSMENT AND PLAN:  Discussed the following assessment and plan:    ICD-10-CM  1. Visit for preventive health examination  Z00.00   2. Need for immunization against influenza  Z23 Flu Vaccine QUAD 36+ mos IM   Anxiety  Controlling   Self  Techniques and cbd  Intermittent left arm ":numbness " augmented by  anxiety    Disc mechanical causes  That can come and go with postion etc .   Normal exam today  . He is right handed . Has hx of left fx clavicle  And surgery   No evidence of cv  Causes  Lab reviewed from 2019 and nl with favorable lipids   .  Can recheck next year.  Over all he is healthy  And hdl last year was in a better range .  Patient Care Team: , Neta MendsWanda K, MD as PCP - General (Internal Medicine) Marisue BrooklynStevenson, Amy, DO (Internal Medicine) Patient Instructions     Your exam is normal   Continue lifestyle intervention healthy eating and exercise .   Labs last year were  Normal        Health Maintenance, Male Adopting a healthy lifestyle and getting preventive care are important in promoting health and wellness. Ask your health care provider about:  The right schedule for you to have regular tests and exams.  Things you can do on your own to prevent diseases and keep yourself healthy. What should I know about diet, weight, and exercise? Eat a healthy diet   Eat a diet that includes plenty of vegetables, fruits, low-fat dairy products, and lean protein.  Do not eat a lot of foods that are high in solid fats, added sugars, or sodium. Maintain a healthy weight Body mass index (BMI) is a measurement that can be used to identify possible weight problems. It estimates body fat based on height and weight. Your health care provider can help determine your BMI and help you achieve or maintain a healthy weight. Get regular exercise Get regular exercise. This is one of the most important things you  can do for your health. Most adults should:  Exercise for at least 150 minutes each week. The exercise should increase your heart rate and make you sweat (moderate-intensity exercise).  Do strengthening exercises at least twice a week. This is in addition to the moderate-intensity exercise.  Spend less time sitting. Even light physical activity can be beneficial. Watch cholesterol and blood lipids Have your blood tested for lipids and cholesterol at 26 years of age, then have this test every 5 years. You may need to have your cholesterol levels checked more often if:  Your lipid or cholesterol levels are high.  You are older than 26 years of age.  You are at high risk for heart disease. What should I know about cancer screening? Many types of cancers can be detected early and may often be prevented. Depending on your health history and family history, you may need to have cancer screening at various ages. This may include screening for:  Colorectal cancer.  Prostate cancer.  Skin cancer.  Lung cancer. What should I know about heart disease, diabetes, and high blood pressure? Blood pressure and heart disease  High blood pressure causes heart disease and increases the risk of stroke. This is more likely to develop in people who have high blood pressure readings, are of African descent, or are overweight.  Talk with your health care provider about your target blood pressure readings.  Have your blood pressure checked: ? Every 3-5 years if you are 7218-26 years of age. ?  Every year if you are 89 years old or older.  If you are between the ages of 32 and 34 and are a current or former smoker, ask your health care provider if you should have a one-time screening for abdominal aortic aneurysm (AAA). Diabetes Have regular diabetes screenings. This checks your fasting blood sugar level. Have the screening done:  Once every three years after age 8 if you are at a normal weight and have  a low risk for diabetes.  More often and at a younger age if you are overweight or have a high risk for diabetes. What should I know about preventing infection? Hepatitis B If you have a higher risk for hepatitis B, you should be screened for this virus. Talk with your health care provider to find out if you are at risk for hepatitis B infection. Hepatitis C Blood testing is recommended for:  Everyone born from [birth year] through 1965.  Anyone with known risk factors for hepatitis C. Sexually transmitted infections (STIs)  You should be screened each year for STIs, including gonorrhea and chlamydia, if: ? You are sexually active and are younger than 26 years of age. ? You are older than 26 years of age and your health care provider tells you that you are at risk for this type of infection. ? Your sexual activity has changed since you were last screened, and you are at increased risk for chlamydia or gonorrhea. Ask your health care provider if you are at risk.  Ask your health care provider about whether you are at high risk for HIV. Your health care provider may recommend a prescription medicine to help prevent HIV infection. If you choose to take medicine to prevent HIV, you should first get tested for HIV. You should then be tested every 3 months for as long as you are taking the medicine. Follow these instructions at home: Lifestyle  Do not use any products that contain nicotine or tobacco, such as cigarettes, e-cigarettes, and chewing tobacco. If you need help quitting, ask your health care provider.  Do not use street drugs.  Do not share needles.  Ask your health care provider for help if you need support or information about quitting drugs. Alcohol use  Do not drink alcohol if your health care provider tells you not to drink.  If you drink alcohol: ? Limit how much you have to 0-2 drinks a day. ? Be aware of how much alcohol is in your drink. In the [place] one drink equals one 12  oz bottle of beer (355 mL), one 5 oz glass of wine (148 mL), or one 1 oz glass of hard liquor (44 mL). General instructions  Schedule regular health, dental, and eye exams.  Stay current with your vaccines.  Tell your health care provider if: ? You often feel depressed. ? You have ever been abused or do not feel safe at home. Summary  Adopting a healthy lifestyle and getting preventive care are important in promoting health and wellness.  Follow your health care provider's instructions about healthy diet, exercising, and getting tested or screened for diseases.  Follow your health care provider's instructions on monitoring your cholesterol and blood pressure. This information is not intended to replace advice given to you by your health care provider. Make sure you discuss any questions you have with your health care provider. Document Released: 01/07/2008 Document Revised: 07/04/2018 Document Reviewed: 07/04/2018 Elsevier Patient Education  2020 [employer].   Lab Results  Component Value  Date   WBC 6.6 10/24/2017   HGB 16.2 10/24/2017   HCT 46.9 10/24/2017   PLT 220.0 10/24/2017   GLUCOSE 78 10/24/2017   CHOL 129 10/24/2017   TRIG 47.0 10/24/2017   HDL 48.40 10/24/2017   LDLCALC 72 10/24/2017   ALT 26 10/24/2017   AST 24 10/24/2017   NA 138 10/24/2017   K 4.4 10/24/2017   CL 99 10/24/2017   CREATININE 0.83 10/24/2017   BUN 10 10/24/2017   CO2 31 10/24/2017   TSH 1.30 10/24/2017   INR 1.01 03/08/2010      Burna Mortimer K.  M.D.

## 2019-04-30 ENCOUNTER — Ambulatory Visit (INDEPENDENT_AMBULATORY_CARE_PROVIDER_SITE_OTHER): Payer: Managed Care, Other (non HMO) | Admitting: Internal Medicine

## 2019-04-30 ENCOUNTER — Encounter: Payer: Self-pay | Admitting: Internal Medicine

## 2019-04-30 ENCOUNTER — Other Ambulatory Visit: Payer: Self-pay

## 2019-04-30 VITALS — BP 120/70 | HR 65 | Temp 97.7°F | Ht 69.0 in | Wt 144.4 lb

## 2019-04-30 DIAGNOSIS — Z0000 Encounter for general adult medical examination without abnormal findings: Secondary | ICD-10-CM | POA: Diagnosis not present

## 2019-04-30 DIAGNOSIS — Z23 Encounter for immunization: Secondary | ICD-10-CM

## 2019-04-30 NOTE — Patient Instructions (Addendum)
Your exam is normal   Continue lifestyle intervention healthy eating and exercise .   Labs last year were  Normal        Health Maintenance, Male Adopting a healthy lifestyle and getting preventive care are important in promoting health and wellness. Ask your health care provider about:  The right schedule for you to have regular tests and exams.  Things you can do on your own to prevent diseases and keep yourself healthy. What should I know about diet, weight, and exercise? Eat a healthy diet   Eat a diet that includes plenty of vegetables, fruits, low-fat dairy products, and lean protein.  Do not eat a lot of foods that are high in solid fats, added sugars, or sodium. Maintain a healthy weight Body mass index (BMI) is a measurement that can be used to identify possible weight problems. It estimates body fat based on height and weight. Your health care provider can help determine your BMI and help you achieve or maintain a healthy weight. Get regular exercise Get regular exercise. This is one of the most important things you can do for your health. Most adults should:  Exercise for at least 150 minutes each week. The exercise should increase your heart rate and make you sweat (moderate-intensity exercise).  Do strengthening exercises at least twice a week. This is in addition to the moderate-intensity exercise.  Spend less time sitting. Even light physical activity can be beneficial. Watch cholesterol and blood lipids Have your blood tested for lipids and cholesterol at 26 years of age, then have this test every 5 years. You may need to have your cholesterol levels checked more often if:  Your lipid or cholesterol levels are high.  You are older than 26 years of age.  You are at high risk for heart disease. What should I know about cancer screening? Many types of cancers can be detected early and may often be prevented. Depending on your health history and family  history, you may need to have cancer screening at various ages. This may include screening for:  Colorectal cancer.  Prostate cancer.  Skin cancer.  Lung cancer. What should I know about heart disease, diabetes, and high blood pressure? Blood pressure and heart disease  High blood pressure causes heart disease and increases the risk of stroke. This is more likely to develop in people who have high blood pressure readings, are of African descent, or are overweight.  Talk with your health care provider about your target blood pressure readings.  Have your blood pressure checked: ? Every 3-5 years if you are 57-6 years of age. ? Every year if you are 33 years old or older.  If you are between the ages of 45 and 2 and are a current or former smoker, ask your health care provider if you should have a one-time screening for abdominal aortic aneurysm (AAA). Diabetes Have regular diabetes screenings. This checks your fasting blood sugar level. Have the screening done:  Once every three years after age 84 if you are at a normal weight and have a low risk for diabetes.  More often and at a younger age if you are overweight or have a high risk for diabetes. What should I know about preventing infection? Hepatitis B If you have a higher risk for hepatitis B, you should be screened for this virus. Talk with your health care provider to find out if you are at risk for hepatitis B infection. Hepatitis C Blood testing  is recommended for:  Everyone born from [birth year] through 1963.  Anyone with known risk factors for hepatitis C. Sexually transmitted infections (STIs)  You should be screened each year for STIs, including gonorrhea and chlamydia, if: ? You are sexually active and are younger than 26 years of age. ? You are older than 26 years of age and your health care provider tells you that you are at risk for this type of infection. ? Your sexual activity has changed since you were last  screened, and you are at increased risk for chlamydia or gonorrhea. Ask your health care provider if you are at risk.  Ask your health care provider about whether you are at high risk for HIV. Your health care provider may recommend a prescription medicine to help prevent HIV infection. If you choose to take medicine to prevent HIV, you should first get tested for HIV. You should then be tested every 3 months for as long as you are taking the medicine. Follow these instructions at home: Lifestyle  Do not use any products that contain nicotine or tobacco, such as cigarettes, e-cigarettes, and chewing tobacco. If you need help quitting, ask your health care provider.  Do not use street drugs.  Do not share needles.  Ask your health care provider for help if you need support or information about quitting drugs. Alcohol use  Do not drink alcohol if your health care provider tells you not to drink.  If you drink alcohol: ? Limit how much you have to 0-2 drinks a day. ? Be aware of how much alcohol is in your drink. In the [place] one drink equals one 12 oz bottle of beer (355 mL), one 5 oz glass of wine (148 mL), or one 1 oz glass of hard liquor (44 mL). General instructions  Schedule regular health, dental, and eye exams.  Stay current with your vaccines.  Tell your health care provider if: ? You often feel depressed. ? You have ever been abused or do not feel safe at home. Summary  Adopting a healthy lifestyle and getting preventive care are important in promoting health and wellness.  Follow your health care provider's instructions about healthy diet, exercising, and getting tested or screened for diseases.  Follow your health care provider's instructions on monitoring your cholesterol and blood pressure. This information is not intended to replace advice given to you by your health care provider. Make sure you discuss any questions you have with your health care provider. Document  Released: 01/07/2008 Document Revised: 07/04/2018 Document Reviewed: 07/04/2018 Elsevier Patient Education  [place].   Lab Results  Component Value Date   WBC 6.6 10/24/2017   HGB 16.2 10/24/2017   HCT 46.9 10/24/2017   PLT 220.0 10/24/2017   GLUCOSE 78 10/24/2017   CHOL 129 10/24/2017   TRIG 47.0 10/24/2017   HDL 48.40 10/24/2017   LDLCALC 72 10/24/2017   ALT 26 10/24/2017   AST 24 10/24/2017   NA 138 10/24/2017   K 4.4 10/24/2017   CL 99 10/24/2017   CREATININE 0.83 10/24/2017   BUN 10 10/24/2017   CO2 31 10/24/2017   TSH 1.30 10/24/2017   INR 1.01 03/08/2010
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
## 2020-05-01 ENCOUNTER — Encounter: Payer: Self-pay | Admitting: Internal Medicine

## 2020-05-01 ENCOUNTER — Ambulatory Visit (INDEPENDENT_AMBULATORY_CARE_PROVIDER_SITE_OTHER): Payer: Managed Care, Other (non HMO) | Admitting: Internal Medicine

## 2020-05-01 ENCOUNTER — Other Ambulatory Visit: Payer: Self-pay

## 2020-05-01 VITALS — BP 128/72 | HR 62 | Temp 98.6°F | Ht 70.0 in | Wt 145.8 lb

## 2020-05-01 DIAGNOSIS — Z0000 Encounter for general adult medical examination without abnormal findings: Secondary | ICD-10-CM | POA: Diagnosis not present

## 2020-05-01 NOTE — Patient Instructions (Addendum)
I agree left upper chest  Sx are probably musculoskeletal related and if  persistent or progressive see Sports medicine  ( [facility])   Get derm to check lesion possible dermatofibroma  Before tattooing area .    Health Maintenance, Male Adopting a healthy lifestyle and getting preventive care are important in promoting health and wellness. Ask your health care provider about:  The right schedule for you to have regular tests and exams.  Things you can do on your own to prevent diseases and keep yourself healthy. What should I know about diet, weight, and exercise? Eat a healthy diet   Eat a diet that includes plenty of vegetables, fruits, low-fat dairy products, and lean protein.  Do not eat a lot of foods that are high in solid fats, added sugars, or sodium. Maintain a healthy weight Body mass index (BMI) is a measurement that can be used to identify possible weight problems. It estimates body fat based on height and weight. Your health care provider can help determine your BMI and help you achieve or maintain a healthy weight. Get regular exercise Get regular exercise. This is one of the most important things you can do for your health. Most adults should:  Exercise for at least 150 minutes each week. The exercise should increase your heart rate and make you sweat (moderate-intensity exercise).  Do strengthening exercises at least twice a week. This is in addition to the moderate-intensity exercise.  Spend less time sitting. Even light physical activity can be beneficial. Watch cholesterol and blood lipids Have your blood tested for lipids and cholesterol at 27 years of age, then have this test every 5 years. You may need to have your cholesterol levels checked more often if:  Your lipid or cholesterol levels are high.  You are older than 27 years of age.  You are at high risk for heart disease. What should I know about cancer screening? Many types of cancers can  be detected early and may often be prevented. Depending on your health history and family history, you may need to have cancer screening at various ages. This may include screening for:  Colorectal cancer.  Prostate cancer.  Skin cancer.  Lung cancer. What should I know about heart disease, diabetes, and high blood pressure? Blood pressure and heart disease  High blood pressure causes heart disease and increases the risk of stroke. This is more likely to develop in people who have high blood pressure readings, are of African descent, or are overweight.  Talk with your health care provider about your target blood pressure readings.  Have your blood pressure checked: ? Every 3-5 years if you are 39-66 years of age. ? Every year if you are 13 years old or older.  If you are between the ages of 28 and 60 and are a current or former smoker, ask your health care provider if you should have a one-time screening for abdominal aortic aneurysm (AAA). Diabetes Have regular diabetes screenings. This checks your fasting blood sugar level. Have the screening done:  Once every three years after age 93 if you are at a normal weight and have a low risk for diabetes.  More often and at a younger age if you are overweight or have a high risk for diabetes. What should I know about preventing infection? Hepatitis B If you have a higher risk for hepatitis B, you should be screened for this virus. Talk with your health care provider to find  out if you are at risk for hepatitis B infection. Hepatitis C Blood testing is recommended for:  Everyone born from [birth year] through 1963.  Anyone with known risk factors for hepatitis C. Sexually transmitted infections (STIs)  You should be screened each year for STIs, including gonorrhea and chlamydia, if: ? You are sexually active and are younger than 27 years of age. ? You are older than 27 years of age and your health care provider tells you that you are at risk  for this type of infection. ? Your sexual activity has changed since you were last screened, and you are at increased risk for chlamydia or gonorrhea. Ask your health care provider if you are at risk.  Ask your health care provider about whether you are at high risk for HIV. Your health care provider may recommend a prescription medicine to help prevent HIV infection. If you choose to take medicine to prevent HIV, you should first get tested for HIV. You should then be tested every 3 months for as long as you are taking the medicine. Follow these instructions at home: Lifestyle  Do not use any products that contain nicotine or tobacco, such as cigarettes, e-cigarettes, and chewing tobacco. If you need help quitting, ask your health care provider.  Do not use street drugs.  Do not share needles.  Ask your health care provider for help if you need support or information about quitting drugs. Alcohol use  Do not drink alcohol if your health care provider tells you not to drink.  If you drink alcohol: ? Limit how much you have to 0-2 drinks a day. ? Be aware of how much alcohol is in your drink. In the [place] one drink equals one 12 oz bottle of beer (355 mL), one 5 oz glass of wine (148 mL), or one 1 oz glass of hard liquor (44 mL). General instructions  Schedule regular health, dental, and eye exams.  Stay current with your vaccines.  Tell your health care provider if: ? You often feel depressed. ? You have ever been abused or do not feel safe at home. Summary  Adopting a healthy lifestyle and getting preventive care are important in promoting health and wellness.  Follow your health care provider's instructions about healthy diet, exercising, and getting tested or screened for diseases.  Follow your health care provider's instructions on monitoring your cholesterol and blood pressure. This information is not intended to replace advice given to you by your health care provider.  Make sure you discuss any questions you have with your health care provider. Document Revised: 07/04/2018 Document Reviewed: 07/04/2018 Elsevier Patient Education  2020 [employer].

## 2022-07-06 ENCOUNTER — Telehealth: Payer: Self-pay | Admitting: Internal Medicine

## 2022-07-06 NOTE — Telephone Encounter (Signed)
Last OV-05/01/2020.   Should I reach out the patient to schedule a CPE then can obtain labs at appointment?

## 2022-07-06 NOTE — Telephone Encounter (Signed)
Patient requesting blood test to screen for herpes.

## 2022-07-07 NOTE — Telephone Encounter (Signed)
Called spoke with patient.   He stated that he has never tested positive for herpes, but he has recently been exposed to someone with herpes.  Patient stated that he has No active lesions, etc.    He was tested at [facility] in  2019, with negative results.   At this time patient does not have medication insurance.   He stated he will contact the office to discuss how much is the out of pocket cost is for an physical.

## 2022-07-13 ENCOUNTER — Encounter: Payer: Self-pay | Admitting: Family

## 2022-07-13 ENCOUNTER — Ambulatory Visit (INDEPENDENT_AMBULATORY_CARE_PROVIDER_SITE_OTHER): Payer: Self-pay | Admitting: Family

## 2022-07-13 VITALS — BP 120/58 | HR 89 | Temp 98.5°F | Ht 70.0 in | Wt 161.2 lb

## 2022-07-13 DIAGNOSIS — Z0000 Encounter for general adult medical examination without abnormal findings: Secondary | ICD-10-CM

## 2022-07-13 DIAGNOSIS — Z113 Encounter for screening for infections with a predominantly sexual mode of transmission: Secondary | ICD-10-CM

## 2022-07-13 NOTE — Progress Notes (Signed)
Complete physical exam  Patient: James Blackwell   DOB: July 03, 1991   31 y.o. Male  MRN: [id number]  Subjective:    Chief Complaint  Patient presents with   Annual Exam   Exposure to STD    Patient states he was exposed to herpes 4 years ago, last test was negative, denies outbreak or any symptoms and requested updated testing    James Blackwell is a 31 y.o. male who presents today for a complete physical exam. He reports consuming a general diet. The patient does not participate in regular exercise at present. He generally feels well. He reports sleeping well. He does not have additional problems to discuss today. He would like STI screening particularly HSV 2 testing. However, he has never had an outbreak and does not have an lesions currently.    Most recent fall risk assessment:     No data to display           Most recent depression screenings:    07/13/2022    2:45 PM 05/01/2020    9:31 AM  PHQ 2/9 Scores  PHQ - 2 Score 1 1  PHQ- 9 Score 2          Patient Care Team: Panosh, Neta Mends, MD as PCP - General (Internal Medicine) Marisue Brooklyn, DO (Internal Medicine)   No outpatient medications prior to visit.   No facility-administered medications prior to visit.    Review of Systems  All other systems reviewed and are negative.         Objective:     BP (!) 120/58 (BP Location: Left Arm, Patient Position: Sitting, Cuff Size: Normal)   Pulse 89   Temp 98.5 F (36.9 C) (Oral)   Ht 5\' 10"  (1.778 m)   Wt 161 lb 3.2 oz (73.1 kg)   SpO2 99%   BMI 23.13 kg/m    Physical Exam Vitals reviewed.  Constitutional:      Appearance: Normal appearance. He is normal weight.  HENT:     Right Ear: Tympanic membrane, ear canal and external ear normal.     Left Ear: Tympanic membrane, ear canal and external ear normal.     Nose: Nose normal.     Mouth/Throat:     Mouth: Mucous membranes are dry.  Cardiovascular:     Rate and Rhythm: Normal rate and regular  rhythm.  Pulmonary:     Effort: Pulmonary effort is normal.     Breath sounds: Normal breath sounds.  Abdominal:     General: Abdomen is flat. Bowel sounds are normal.     Palpations: Abdomen is soft.  Musculoskeletal:     Cervical back: Normal range of motion and neck supple.  Skin:    General: Skin is warm and dry.  Neurological:     General: No focal deficit present.     Mental Status: He is alert and oriented to person, place, and time.  Psychiatric:        Mood and Affect: Mood normal.        Behavior: Behavior normal.      No results found for any visits on 07/13/22.     Assessment & Plan:    Routine Health Maintenance and Physical Exam  Immunization History  Administered Date(s) Administered   Influenza,inj,Quad PF,6+ Mos 04/30/2019   Moderna Sars-Covid-2 Vaccination 01/23/2020, 03/01/2020   Tdap 10/24/2017    Health Maintenance  Topic Date Due   COVID-19 Vaccine (3 - 2023-24 season) 07/29/2022 (  Originally 03/25/2022)   INFLUENZA VACCINE  10/23/2022 (Originally 02/22/2022)   Hepatitis C Screening  07/14/2023 (Originally 08/05/2010)   DTaP/Tdap/Td (2 - Td or Tdap) 10/25/2027   HIV Screening  Completed   HPV VACCINES  Aged Out    Discussed health benefits of physical activity, and encouraged him to engage in regular exercise appropriate for his age and condition.  Problem List Items Addressed This Visit   None Visit Diagnoses     Routine general medical examination at a health care facility    -  Primary   Relevant Orders   CBC with Differential   Comprehensive metabolic panel   Lipid panel   HIV antibody   Hepatitis C antibody screen   Herpes Simplex Virus Culture   RPR   Screen for sexually transmitted diseases       Relevant Orders   HSV(herpes simplex vrs) 1+2 ab-IgG      No follow-ups on file.  Discussed the benefits vs. Risk of HSV testing and possibility of false positive results. Patient would like ot have HSV IgG testing performed along with  other STI panel. Declines Gonorrhea and Chlamydia testing as he had that done recently.   Eulis Foster, FNP

## 2022-07-13 NOTE — Patient Instructions (Addendum)
Obtaining a blood test for Herpes can often yield false-positive results. Therefore, a culture is considered the "gold standard" for herpes screening.  [email or web address].[email or web address]   Health Maintenance, Male Adopting a healthy lifestyle and getting preventive care are important in promoting health and wellness. Ask your health care provider about: The right schedule for you to have regular tests and exams. Things you can do on your own to prevent diseases and keep yourself healthy. What should I know about diet, weight, and exercise? Eat a healthy diet  Eat a diet that includes plenty of vegetables, fruits, low-fat dairy products, and lean protein. Do not eat a lot of foods that are high in solid fats, added sugars, or sodium. Maintain a healthy weight Body mass index (BMI) is a measurement that can be used to identify possible weight problems. It estimates body fat based on height and weight. Your health care provider can help determine your BMI and help you achieve or maintain a healthy weight. Get regular exercise Get regular exercise. This is one of the most important things you can do for your health. Most adults should: Exercise for at least 150 minutes each week. The exercise should increase your heart rate and make you sweat (moderate-intensity exercise). Do strengthening exercises at least twice a week. This is in addition to the moderate-intensity exercise. Spend less time sitting. Even light physical activity can be beneficial. Watch cholesterol and blood lipids Have your blood tested for lipids and cholesterol at 29 years of age, then have this test every 5 years. You may need to have your cholesterol levels checked more often if: Your lipid or cholesterol levels are high. You are older than 29 years of age. You are at high risk for heart disease. What should I know about cancer screening? Many types of cancers can be detected early and may often be prevented.  Depending on your health history and family history, you may need to have cancer screening at various ages. This may include screening for: Colorectal cancer. Prostate cancer. Skin cancer. Lung cancer. What should I know about heart disease, diabetes, and high blood pressure? Blood pressure and heart disease High blood pressure causes heart disease and increases the risk of stroke. This is more likely to develop in people who have high blood pressure readings or are overweight. Talk with your health care provider about your target blood pressure readings. Have your blood pressure checked: Every 3-5 years if you are 29-29 years of age. Every year if you are 29 years old or older. If you are between the ages of 3 and 81 and are a current or former smoker, ask your health care provider if you should have a one-time screening for abdominal aortic aneurysm (AAA). Diabetes Have regular diabetes screenings. This checks your fasting blood sugar level. Have the screening done: Once every three years after age 29 if you are at a normal weight and have a low risk for diabetes. More often and at a younger age if you are overweight or have a high risk for diabetes. What should I know about preventing infection? Hepatitis B If you have a higher risk for hepatitis B, you should be screened for this virus. Talk with your health care provider to find out if you are at risk for hepatitis B infection. Hepatitis C Blood testing is recommended for: Everyone born from [birth year] through 1963. Anyone with known risk factors for hepatitis C. Sexually transmitted infections (STIs) You should be screened each year for STIs, including  gonorrhea and chlamydia, if: You are sexually active and are younger than 29 years of age. You are older than 29 years of age and your health care provider tells you that you are at risk for this type of infection. Your sexual activity has changed since you were last screened, and you are  at increased risk for chlamydia or gonorrhea. Ask your health care provider if you are at risk. Ask your health care provider about whether you are at high risk for HIV. Your health care provider may recommend a prescription medicine to help prevent HIV infection. If you choose to take medicine to prevent HIV, you should first get tested for HIV. You should then be tested every 3 months for as long as you are taking the medicine. Follow these instructions at home: Alcohol use Do not drink alcohol if your health care provider tells you not to drink. If you drink alcohol: Limit how much you have to 0-2 drinks a day. Know how much alcohol is in your drink. In the [place] one drink equals one 12 oz bottle of beer (355 mL), one 5 oz glass of wine (148 mL), or one 1 oz glass of hard liquor (44 mL). Lifestyle Do not use any products that contain nicotine or tobacco. These products include cigarettes, chewing tobacco, and vaping devices, such as e-cigarettes. If you need help quitting, ask your health care provider. Do not use street drugs. Do not share needles. Ask your health care provider for help if you need support or information about quitting drugs. General instructions Schedule regular health, dental, and eye exams. Stay current with your vaccines. Tell your health care provider if: You often feel depressed. You have ever been abused or do not feel safe at home. Summary Adopting a healthy lifestyle and getting preventive care are important in promoting health and wellness. Follow your health care provider's instructions about healthy diet, exercising, and getting tested or screened for diseases. Follow your health care provider's instructions on monitoring your cholesterol and blood pressure. This information is not intended to replace advice given to you by your health care provider. Make sure you discuss any questions you have with your health care provider. Document Revised: 11/30/2020  Document Reviewed: 11/30/2020 Elsevier Patient Education  2023 [employer].

## 2022-07-14 LAB — CBC WITH DIFFERENTIAL/PLATELET
Basophils Absolute: 0.1 10*3/uL (ref 0.0–0.1)
Basophils Relative: 0.9 % (ref 0.0–3.0)
Eosinophils Absolute: 0.1 10*3/uL (ref 0.0–0.7)
Eosinophils Relative: 0.9 % (ref 0.0–5.0)
HCT: 49.6 % (ref 39.0–52.0)
Hemoglobin: 17.2 g/dL — ABNORMAL HIGH (ref 13.0–17.0)
Lymphocytes Relative: 32.5 % (ref 12.0–46.0)
Lymphs Abs: 2.1 10*3/uL (ref 0.7–4.0)
MCHC: 34.8 g/dL (ref 30.0–36.0)
MCV: 90.7 fl (ref 78.0–100.0)
Monocytes Absolute: 0.7 10*3/uL (ref 0.1–1.0)
Monocytes Relative: 10.3 % (ref 3.0–12.0)
Neutro Abs: 3.5 10*3/uL (ref 1.4–7.7)
Neutrophils Relative %: 55.4 % (ref 43.0–77.0)
Platelets: 245 10*3/uL (ref 150.0–400.0)
RBC: 5.47 Mil/uL (ref 4.22–5.81)
RDW: 13 % (ref 11.5–15.5)
WBC: 6.4 10*3/uL (ref 4.0–10.5)

## 2022-07-14 LAB — COMPREHENSIVE METABOLIC PANEL
ALT: 26 U/L (ref 0–53)
AST: 21 U/L (ref 0–37)
Albumin: 5 g/dL (ref 3.5–5.2)
Alkaline Phosphatase: 95 U/L (ref 39–117)
BUN: 15 mg/dL (ref 6–23)
CO2: 29 mEq/L (ref 19–32)
Calcium: 9.7 mg/dL (ref 8.4–10.5)
Chloride: 104 mEq/L (ref 96–112)
Creatinine, Ser: 0.97 mg/dL (ref 0.40–1.50)
GFR: 105.18 mL/min (ref >60.00)
Glucose, Bld: 127 mg/dL — ABNORMAL HIGH (ref 70–99)
Potassium: 3.8 mEq/L (ref 3.5–5.1)
Sodium: 141 mEq/L (ref 135–145)
Total Bilirubin: 0.7 mg/dL (ref 0.2–1.2)
Total Protein: 7.7 g/dL (ref 6.0–8.3)

## 2022-07-14 LAB — HSV(HERPES SIMPLEX VRS) I + II AB-IGG
HAV 1 IGG,TYPE SPECIFIC AB: <0.9 index
HSV 2 IGG,TYPE SPECIFIC AB: <0.9 index

## 2022-07-14 LAB — HIV ANTIBODY (ROUTINE TESTING W REFLEX): HIV 1&2 Ab, 4th Generation: NONREACTIVE

## 2022-07-14 LAB — LIPID PANEL
Cholesterol: 128 mg/dL (ref 0–200)
HDL: 36.3 mg/dL — ABNORMAL LOW (ref >39.00)
LDL Cholesterol: 67 mg/dL (ref 0–99)
NonHDL: 91.69
Total CHOL/HDL Ratio: 4
Triglycerides: 124 mg/dL (ref 0.0–149.0)
VLDL: 24.8 mg/dL (ref 0.0–40.0)

## 2022-07-14 LAB — RPR: RPR Ser Ql: NONREACTIVE

## 2022-07-14 LAB — HEPATITIS C ANTIBODY: Hepatitis C Ab: NONREACTIVE

## 2022-09-07 ENCOUNTER — Encounter: Payer: Self-pay | Admitting: Internal Medicine

## 2024-05-22 ENCOUNTER — Ambulatory Visit: Admitting: Internal Medicine

## 2024-05-23 ENCOUNTER — Telehealth: Payer: Self-pay | Admitting: Internal Medicine

## 2024-06-04 ENCOUNTER — Ambulatory Visit: Admitting: Internal Medicine
